# Patient Record
Sex: Male | Born: 1980 | Race: White | Hispanic: No | Marital: Single | Smoking: Never smoker
Health system: Southern US, Community
[De-identification: ages and names within clinical notes are randomized; demographics above are authoritative.]

## PROBLEM LIST (undated history)

## (undated) DIAGNOSIS — D580 Hereditary spherocytosis: Secondary | ICD-10-CM

## (undated) DIAGNOSIS — Z8547 Personal history of malignant neoplasm of testis: Secondary | ICD-10-CM

## (undated) DIAGNOSIS — K121 Other forms of stomatitis: Secondary | ICD-10-CM

## (undated) DIAGNOSIS — S022XXA Fracture of nasal bones, initial encounter for closed fracture: Secondary | ICD-10-CM

## (undated) DIAGNOSIS — J343 Hypertrophy of nasal turbinates: Secondary | ICD-10-CM

## (undated) DIAGNOSIS — K219 Gastro-esophageal reflux disease without esophagitis: Secondary | ICD-10-CM

## (undated) DIAGNOSIS — S0121XA Laceration without foreign body of nose, initial encounter: Secondary | ICD-10-CM

## (undated) DIAGNOSIS — C801 Malignant (primary) neoplasm, unspecified: Secondary | ICD-10-CM

## (undated) HISTORY — DX: Hereditary spherocytosis: D58.0

## (undated) HISTORY — PX: INGUINAL HERNIA REPAIR: SHX194

## (undated) HISTORY — PX: SPLENECTOMY: SUR1306

## (undated) HISTORY — PX: FOOT TENDON SURGERY: SHX958

## (undated) HISTORY — PX: SHOULDER CAPSULORRHAPHY: SUR188

---
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
## 2009-11-06 ENCOUNTER — Encounter: Payer: Self-pay | Admitting: Pulmonary Disease

## 2009-11-19 ENCOUNTER — Emergency Department (HOSPITAL_COMMUNITY): Admission: EM | Admit: 2009-11-19 | Discharge: 2009-11-19 | Payer: Self-pay | Admitting: Emergency Medicine

## 2009-11-28 ENCOUNTER — Ambulatory Visit: Payer: Self-pay | Admitting: Pulmonary Disease

## 2009-11-28 ENCOUNTER — Encounter: Payer: Self-pay | Admitting: Pulmonary Disease

## 2009-11-28 DIAGNOSIS — R0602 Shortness of breath: Secondary | ICD-10-CM | POA: Insufficient documentation

## 2009-11-28 DIAGNOSIS — I10 Essential (primary) hypertension: Secondary | ICD-10-CM | POA: Insufficient documentation

## 2009-12-02 ENCOUNTER — Ambulatory Visit (HOSPITAL_COMMUNITY): Admission: RE | Admit: 2009-12-02 | Discharge: 2009-12-02 | Payer: Self-pay | Admitting: Pulmonary Disease

## 2009-12-02 ENCOUNTER — Encounter: Payer: Self-pay | Admitting: Pulmonary Disease

## 2009-12-09 ENCOUNTER — Telehealth: Payer: Self-pay | Admitting: Pulmonary Disease

## 2009-12-11 ENCOUNTER — Encounter: Payer: Self-pay | Admitting: Pulmonary Disease

## 2010-01-21 ENCOUNTER — Encounter
Admission: RE | Admit: 2010-01-21 | Discharge: 2010-01-21 | Payer: Self-pay | Source: Home / Self Care | Attending: Occupational Medicine | Admitting: Occupational Medicine

## 2010-02-11 NOTE — Assessment & Plan Note (Signed)
Summary: consult for cough and dyspnea   Visit Type:  Initial Consult Copy to:  Lavada Mesi MD Primary Provider/Referring Provider:  Lavada Mesi MD  CC:  Pulmonary consult. pt c/o wheezing when he does activity since being exposed to halotron. Marland Kitchen  History of Present Illness: The pt is a 30y/o male who I have been asked to see for breathing issues.  He was in his usual state of health until June of this year, when he was exposed to the spray of a [employer] forces technical [employer].  He inhaled this into his nose and throat, and subsequently had cough and wheezing thereafter.  He was treated with a course of prednisone with improvement, but he did not return to his usual baseline.  He had a cough that persisted primarily with activity.  He also feels his exertional tolerance is not back to his prior baseline either.  He does well with moderate exertion, but gets sob, cough, and chest tightness with any kind of heavy exertional activity.  Last month he was put on flovent and as needed albuterol, and feels the albuterol does help at times.  He has no prior history of asthma,  and recent cxr shows only prominent BV markings with no acute process.  Current Medications (verified): 1)  Flovent Hfa 44 Mcg/act Aero (Fluticasone Propionate  Hfa) .... 2 Puffs By Mouth Two Times A Day 2)  Ventolin Hfa 108 (90 Base) Mcg/act Aers (Albuterol Sulfate) .... 2 Puffs Every 4 Hrs As Needed  Allergies (verified): 1)  ! Codeine  Past History:  Past Medical History: Hypertension  Past Surgical History: splenectomy right shoulder posterior capsule repair double hernia surgery right toe surgery  Family History: Reviewed history from 11/27/2009 and no changes required. heart disease hypertension  Social History: Reviewed history from 11/27/2009 and no changes required. single Patient never smoked.  occupation--fire fighter  Review of Systems       The patient complains of shortness of breath with  activity, productive cough, weight change, headaches, and nasal congestion/difficulty breathing through nose.  The patient denies shortness of breath at rest, non-productive cough, coughing up blood, chest pain, irregular heartbeats, acid heartburn, indigestion, loss of appetite, abdominal pain, difficulty swallowing, sore throat, tooth/dental problems, sneezing, itching, ear ache, anxiety, depression, hand/feet swelling, joint stiffness or pain, rash, change in color of mucus, and fever.    Vital Signs:  Patient profile:   30 year old male Height:      72 inches Weight:      238.50 pounds BMI:     32.46 O2 Sat:      98 % on Room air Temp:     98 degrees F oral Pulse rate:   66 / minute BP sitting:   126 / 74  (left arm) Cuff size:   large  Vitals Entered By: Carver Fila (November 28, 2009 2:25 PM)  O2 Flow:  Room air CC: Pulmonary consult. pt c/o wheezing when he does activity since being exposed to halotron.  Comments meds and allergies updated Phone number updated Carver Fila  November 28, 2009 2:29 PM    Physical Exam  General:  wd male in nad Eyes:  PERRLA and EOMI.   Nose:  patent without discharge Mouth:  prominent tonsils mild elongation of soft palate and uvula Neck:  no jvd, tmg, LN Lungs:  totally clear to auscultation, no wheezing or rhonchi Heart:  rrr, no mrg Abdomen:  soft and nontender, bs+ Extremities:  no edema or cyanosis, pulses  intact distally  Neurologic:  alert and oriented, moves all 4.   Impression & Recommendations:  Problem # 1:  DYSPNEA (ICD-786.05) the pt has persistent issues with cough and doe after an inhalational exposure to halotron gas.  He has been treated with a course of prednisone and ICS without complete improvement, and his cxr shows no acute process.  It is unclear whether this is simply an upper airway issue from irritation, or whether he may have RAD associated with his exposure.  His spirometry today is totally normal, but he  would need a methacholine challenge test to put the issue to rest.  The pt agrees to proceed.  Medications Added to Medication List This Visit: 1)  Flovent Hfa 44 Mcg/act Aero (Fluticasone propionate  hfa) .... 2 puffs by mouth two times a day 2)  Ventolin Hfa 108 (90 Base) Mcg/act Aers (Albuterol sulfate) .... 2 puffs every 4 hrs as needed  Other Orders: Consultation Level IV (16109) Pulmonary Referral (Pulmonary) Spirometry w/Graph (94010)  Patient Instructions: 1)  stop flovent, but can take albuterol as needed. 2)  will schedule for a methacholine challenge test to look for reactive airways disease. 3)  I will arrange followup once the results are available.   Immunization History:  Influenza Immunization History:    Influenza:  historical (10/12/2009)

## 2010-02-11 NOTE — Miscellaneous (Signed)
  Clinical Lists Changes  meth challenge negative for reactive airways.  I have discussed results with pt.  Very unlikely his symptoms are coming from RAD or asthma.  He may have an upper airway/large airway irritation that is continuing to make him cough with heavy exertional activities.  I have offered to call in tessalon pearls, and also give him one more round of prednisone for upper airway inflammation.  He has prednisone at home, and wil call us with the tablet size.  In the meantime, I have asked him to get back to his usual life including heavy exercise, and if his symptoms return will try to work him into sched within 24 hrs.  he may need upper airway eval with ENT?  Will also consider the role of occult reflux.

## 2010-02-11 NOTE — Progress Notes (Signed)
Summary: results in VIP folder  Phone Note Call from Patient Call back at Home Phone [phone]   Caller: Patient Call For: clance Summary of Call: pt wants results of methacholine challenge test.  Initial call taken by: Tivis Ringer, CNA,  December 09, 2009 11:29 AM  Follow-up for Phone Call        Spoke with patient-aware that results are not in EMR at this time but will send message to [facility] to address. Pt had test on 12-02-09 at [facility]  December 09, 2009 12:31 PM   Additional Follow-up for Phone Call Additional follow up Details #1::        please have the data sent over from [facility] so I can look at. Additional Follow-up by: Barbaraann Share MD,  December 09, 2009 5:55 PM    Additional Follow-up for Phone Call Additional follow up Details #2::    called [facility], spoke with carol in respitory. She will fax data to triage.  Gweneth Dimitri RN  December 10, 2009 8:42 AM  Results received and placed in [facility] very important folder.  Gweneth Dimitri RN  December 10, 2009 8:59 AM    Additional Follow-up for Phone Call Additional follow up Details #3:: Details for Additional Follow-up Action Taken: see clinical list update. Additional Follow-up by: Barbaraann Share MD,  December 11, 2009 5:49 PM

## 2010-02-11 NOTE — Letter (Signed)
Summary: [facility]   Imported By: Sherian Rein 11/18/2009 09:52:47  _____________________________________________________________________  External Attachment:    Type:   Image     Comment:   External Document

## 2010-03-24 ENCOUNTER — Telehealth (INDEPENDENT_AMBULATORY_CARE_PROVIDER_SITE_OTHER): Payer: Self-pay | Admitting: *Deleted

## 2010-04-01 NOTE — Progress Notes (Signed)
Summary: FAX REQUEST  Phone Note From Other Clinic   Caller: SHARON TARP- W/ [employer] Call For: [facility] Summary of Call: WANTS COPY OF LAST OV (IN NOV- 2011). FAX TO: Salome ArntJuel Burrow: [phone]. CONTACT # IS [id number] Initial call taken by: Tivis Ringer, CNA,  March 24, 2010 11:49 AM  Follow-up for Phone Call        Faxed records.//Juanita Follow-up by: Darletta Moll,  March 24, 2010 4:21 PM

## 2010-04-11 ENCOUNTER — Other Ambulatory Visit: Payer: Self-pay | Admitting: Family Medicine

## 2010-04-11 DIAGNOSIS — C6290 Malignant neoplasm of unspecified testis, unspecified whether descended or undescended: Secondary | ICD-10-CM

## 2010-04-15 ENCOUNTER — Ambulatory Visit
Admission: RE | Admit: 2010-04-15 | Discharge: 2010-04-15 | Disposition: A | Discharge status: Home / Self Care | Payer: BC Managed Care – PPO | Source: Ambulatory Visit | Attending: Family Medicine | Admitting: Family Medicine

## 2010-04-15 DIAGNOSIS — C6290 Malignant neoplasm of unspecified testis, unspecified whether descended or undescended: Secondary | ICD-10-CM

## 2010-04-15 MED ORDER — IOHEXOL 300 MG/ML  SOLN
125.0000 mL | Freq: Once | INTRAMUSCULAR | Status: AC | PRN
  Administered 2010-04-15: 125 mL via INTRAVENOUS

## 2010-04-25 ENCOUNTER — Other Ambulatory Visit: Payer: Self-pay | Admitting: Oncology

## 2010-04-25 ENCOUNTER — Encounter (HOSPITAL_BASED_OUTPATIENT_CLINIC_OR_DEPARTMENT_OTHER): Payer: BC Managed Care – PPO | Admitting: Oncology

## 2010-04-25 ENCOUNTER — Ambulatory Visit (HOSPITAL_COMMUNITY)
Admission: RE | Admit: 2010-04-25 | Discharge: 2010-04-25 | Disposition: A | Discharge status: Home / Self Care | Payer: BC Managed Care – PPO | Source: Ambulatory Visit | Attending: Oncology | Admitting: Oncology

## 2010-04-25 DIAGNOSIS — C6290 Malignant neoplasm of unspecified testis, unspecified whether descended or undescended: Secondary | ICD-10-CM | POA: Insufficient documentation

## 2010-04-25 DIAGNOSIS — Z9089 Acquired absence of other organs: Secondary | ICD-10-CM | POA: Insufficient documentation

## 2010-07-24 ENCOUNTER — Encounter (HOSPITAL_BASED_OUTPATIENT_CLINIC_OR_DEPARTMENT_OTHER): Payer: BC Managed Care – PPO | Admitting: Oncology

## 2010-07-24 ENCOUNTER — Other Ambulatory Visit: Payer: Self-pay | Admitting: Oncology

## 2010-07-24 DIAGNOSIS — C6290 Malignant neoplasm of unspecified testis, unspecified whether descended or undescended: Secondary | ICD-10-CM

## 2010-07-24 LAB — CBC WITH DIFFERENTIAL/PLATELET
BASO%: 0.6 % (ref 0.0–2.0)
Basophils Absolute: 0.1 10*3/uL (ref 0.0–0.1)
EOS%: 1.1 % (ref 0.0–7.0)
HCT: 46.1 % (ref 38.4–49.9)
LYMPH%: 20.1 % (ref 14.0–49.0)
MCH: 35.3 pg — ABNORMAL HIGH (ref 27.2–33.4)
MCHC: 36.7 g/dL — ABNORMAL HIGH (ref 32.0–36.0)
MCV: 96.1 fL (ref 79.3–98.0)
MONO%: 6.2 % (ref 0.0–14.0)
NEUT%: 72 % (ref 39.0–75.0)
lymph#: 1.9 10*3/uL (ref 0.9–3.3)

## 2010-07-27 LAB — COMPREHENSIVE METABOLIC PANEL
ALT: 33 U/L (ref 0–53)
AST: 27 U/L (ref 0–37)
Alkaline Phosphatase: 61 U/L (ref 39–117)
BUN: 17 mg/dL (ref 6–23)
Chloride: 103 mEq/L (ref 96–112)
Creatinine, Ser: 0.92 mg/dL (ref 0.50–1.35)
Total Bilirubin: 2.5 mg/dL — ABNORMAL HIGH (ref 0.3–1.2)

## 2010-07-27 LAB — BETA HCG QUANT (REF LAB): Beta hCG, Tumor Marker: <0.5 m[IU]/mL (ref <5.0)

## 2010-10-21 ENCOUNTER — Other Ambulatory Visit: Payer: Self-pay | Admitting: Oncology

## 2010-10-21 ENCOUNTER — Ambulatory Visit (HOSPITAL_COMMUNITY)
Admission: RE | Admit: 2010-10-21 | Discharge: 2010-10-21 | Disposition: A | Discharge status: Home / Self Care | Payer: BC Managed Care – PPO | Source: Ambulatory Visit | Attending: Oncology | Admitting: Oncology

## 2010-10-21 ENCOUNTER — Encounter (HOSPITAL_BASED_OUTPATIENT_CLINIC_OR_DEPARTMENT_OTHER): Payer: BC Managed Care – PPO | Admitting: Oncology

## 2010-10-21 DIAGNOSIS — C6290 Malignant neoplasm of unspecified testis, unspecified whether descended or undescended: Secondary | ICD-10-CM

## 2010-10-21 DIAGNOSIS — Z9089 Acquired absence of other organs: Secondary | ICD-10-CM | POA: Insufficient documentation

## 2010-10-21 LAB — CMP (CANCER CENTER ONLY)
ALT(SGPT): 32 U/L (ref 10–47)
CO2: 27 mEq/L (ref 18–33)
Calcium: 9.4 mg/dL (ref 8.0–10.3)
Chloride: 103 mEq/L (ref 98–108)
Creat: 0.8 mg/dl (ref 0.6–1.2)
Glucose, Bld: 101 mg/dL (ref 73–118)
Sodium: 143 mEq/L (ref 128–145)
Total Bilirubin: 2.3 mg/dl — ABNORMAL HIGH (ref 0.20–1.60)
Total Protein: 7.6 g/dL (ref 6.4–8.1)

## 2010-10-21 LAB — CBC WITH DIFFERENTIAL/PLATELET
BASO%: 1 % (ref 0.0–2.0)
Eosinophils Absolute: 0.1 10*3/uL (ref 0.0–0.5)
HCT: 45.9 % (ref 38.4–49.9)
HGB: 16.9 g/dL (ref 13.0–17.1)
LYMPH%: 30.1 % (ref 14.0–49.0)
MCHC: 36.8 g/dL — ABNORMAL HIGH (ref 32.0–36.0)
MONO#: 0.9 10*3/uL (ref 0.1–0.9)
NEUT#: 4.9 10*3/uL (ref 1.5–6.5)
NEUT%: 57.1 % (ref 39.0–75.0)
Platelets: 471 10*3/uL — ABNORMAL HIGH (ref 140–400)
WBC: 8.6 10*3/uL (ref 4.0–10.3)
lymph#: 2.6 10*3/uL (ref 0.9–3.3)

## 2010-10-21 MED ORDER — IOHEXOL 300 MG/ML  SOLN
100.0000 mL | Freq: Once | INTRAMUSCULAR | Status: AC | PRN
  Administered 2010-10-21: 100 mL via INTRAVENOUS

## 2010-10-24 LAB — LACTATE DEHYDROGENASE: LDH: 137 U/L (ref 94–250)

## 2010-10-24 LAB — AFP TUMOR MARKER: AFP-Tumor Marker: 4.4 ng/mL (ref 0.0–8.0)

## 2011-01-10 ENCOUNTER — Telehealth: Payer: Self-pay | Admitting: Oncology

## 2011-01-10 NOTE — Telephone Encounter (Signed)
pt was due for 10/2010 appt but was moved due to epic,pt r/s to 03/05/11,pt aware   aom

## 2011-01-30 ENCOUNTER — Other Ambulatory Visit: Payer: BC Managed Care – PPO | Admitting: Lab

## 2011-01-30 ENCOUNTER — Ambulatory Visit: Payer: BC Managed Care – PPO | Admitting: Oncology

## 2011-02-09 ENCOUNTER — Encounter: Payer: Self-pay | Admitting: *Deleted

## 2011-02-27 ENCOUNTER — Encounter: Payer: Self-pay | Admitting: *Deleted

## 2011-03-05 ENCOUNTER — Ambulatory Visit (HOSPITAL_BASED_OUTPATIENT_CLINIC_OR_DEPARTMENT_OTHER): Payer: BC Managed Care – PPO | Admitting: Oncology

## 2011-03-05 ENCOUNTER — Telehealth: Payer: Self-pay | Admitting: Oncology

## 2011-03-05 ENCOUNTER — Ambulatory Visit: Payer: BC Managed Care – PPO | Admitting: Lab

## 2011-03-05 VITALS — BP 125/69 | HR 65 | Temp 97.0°F | Ht 73.2 in | Wt 239.6 lb

## 2011-03-05 DIAGNOSIS — C6290 Malignant neoplasm of unspecified testis, unspecified whether descended or undescended: Secondary | ICD-10-CM

## 2011-03-05 LAB — CBC WITH DIFFERENTIAL/PLATELET
Eosinophils Absolute: 0.1 10*3/uL (ref 0.0–0.5)
MONO#: 0.9 10*3/uL (ref 0.1–0.9)
NEUT#: 4.9 10*3/uL (ref 1.5–6.5)
RBC: 4.82 10*6/uL (ref 4.20–5.82)
RDW: 12.8 % (ref 11.0–14.6)
WBC: 8.3 10*3/uL (ref 4.0–10.3)
lymph#: 2.4 10*3/uL (ref 0.9–3.3)

## 2011-03-05 NOTE — Telephone Encounter (Signed)
8/20 appt made and printed for pt  aom

## 2011-03-05 NOTE — Progress Notes (Signed)
Hematology and Oncology Follow Up Visit  [name] [id number] 1980-08-09 31 y.o. 03/05/2011 9:16 AM CC: [name], M.D.    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  A lot of the details remained elusive.  At this point current therapy is an active surveillance.  Interim History:  [name] is a pleasant 31 year old gentleman, whom I saw for the first time back in April 2011, was referred here by [name] after he gave a personal history of testicular cancer that was treated out of town.  He has had imaging studies done by [name] as well as tumor markers and all have been an unremarkable.  He expressed interest in establishing care here at the cancer center for active surveillance.  Since the last time I saw him really he did not report any problems or any complications.  Had not had any health issues.  Did not report any abdominal pain, had not reported any pelvic pain.  Had not reported any masses or lesions.  Overall performance status activity level remains excellent. He is reporting shoulder pain and being evaluated by [name].  Medications: I have reviewed the patient's current medications. Current outpatient prescriptions:cetirizine (ZYRTEC) 10 MG tablet, Take 10 mg by mouth as needed., Disp: , Rfl:   Allergies:  Allergies  Allergen Reactions  . Codeine     REACTION: hyper    Past Medical History, Surgical history, Social history, and Family History were reviewed and updated.  Review of Systems: Constitutional:  Negative for fever, chills, night sweats, anorexia, weight loss, pain. Cardiovascular: no chest pain or dyspnea on exertion Respiratory: no cough, shortness of breath, or wheezing Neurological: no TIA or stroke symptoms Dermatological: negative ENT: negative Skin: Negative. Gastrointestinal: no abdominal pain, change in  bowel habits, or black or bloody stools Genito-Urinary: no dysuria, trouble voiding, or hematuria Hematological and Lymphatic: negative Breast: negative Musculoskeletal: negative Remaining ROS negative. Physical Exam: Blood pressure 125/69, pulse 65, temperature 97 F (36.1 C), temperature source Oral, height 6' 1.2" (1.859 m), weight 239 lb 9.6 oz (108.682 kg). ECOG: 0 General appearance: alert Head: Normocephalic, without obvious abnormality, atraumatic Neck: no adenopathy, no carotid bruit, no JVD, supple, symmetrical, trachea midline and thyroid not enlarged, symmetric, no tenderness/mass/nodules Lymph nodes: Cervical, supraclavicular, and axillary nodes normal. Heart:regular rate and rhythm, S1, S2 normal, no murmur, click, rub or gallop Lung:chest clear, no wheezing, rales, normal symmetric air entry Abdomin: soft, non-tender, without masses or organomegaly EXT:no erythema, induration, or nodules   Lab Results: Lab Results  Component Value Date   WBC 8.6 10/21/2010   HGB 16.9 10/21/2010   HCT 45.9 10/21/2010   MCV 95.9 10/21/2010   PLT 471* 10/21/2010     Chemistry      Component Value Date/Time   NA 143 10/21/2010 0804   NA 139 07/24/2010 0842   NA 139 07/24/2010 0842   NA 139 07/24/2010 0842   K 4.6 10/21/2010 0804   K 4.5 07/24/2010 0842   K 4.5 07/24/2010 0842   K 4.5 07/24/2010 0842   CL 103 10/21/2010 0804   CL 103 07/24/2010 0842   CL 103 07/24/2010 0842   CL 103 07/24/2010 0842   CO2 27 10/21/2010 0804   CO2 28 07/24/2010 0842   CO2 28 07/24/2010 0842   CO2 28 07/24/2010 0842   BUN 11 10/21/2010 0804   BUN 17  07/24/2010 0842   BUN 17 07/24/2010 0842   BUN 17 07/24/2010 0842   CREATININE 0.8 10/21/2010 0804   CREATININE 0.92 07/24/2010 0842   CREATININE 0.92 07/24/2010 0842   CREATININE 0.92 07/24/2010 0842      Component Value Date/Time   CALCIUM 9.4 10/21/2010 0804   CALCIUM 10.6* 07/24/2010 0842   CALCIUM 10.6* 07/24/2010 0842   CALCIUM 10.6* 07/24/2010 0842   ALKPHOS 57  10/21/2010 0804   ALKPHOS 61 07/24/2010 0842   ALKPHOS 61 07/24/2010 0842   ALKPHOS 61 07/24/2010 0842   AST 23 10/21/2010 0804   AST 27 07/24/2010 0842   AST 27 07/24/2010 0842   AST 27 07/24/2010 0842   ALT 33 07/24/2010 0842   ALT 33 07/24/2010 0842   ALT 33 07/24/2010 0842   BILITOT 2.30* 10/21/2010 0804   BILITOT 2.5* 07/24/2010 0842   BILITOT 2.5* 07/24/2010 0842   BILITOT 2.5* 07/24/2010 0842        Impression and Plan:  This is a pleasant 31 year old with the following issues.   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  At this point I really see no evidence of any recurrent disease.  At this point there is really no evidence of any malignancy on CT scan, chest x-ray or tumor marker.  For the time being I will continue to perform active surveillance of [name].I will be hesitant to do any more screening studies on him without any concrete documentation of the exact pathology at this point.  [name] continues to be vague in terms of his history as given me from his testicular cancer standpoint and I was not able to get the exact pathology and the exact treatment that he has received; however, if we are able to establish that between now and then, then we will resume an active surveillance as scheduled.  [facility], MD 2/21/20139:16 AM

## 2011-03-08 LAB — COMPREHENSIVE METABOLIC PANEL
Albumin: 4.4 g/dL (ref 3.5–5.2)
Alkaline Phosphatase: 56 U/L (ref 39–117)
CO2: 26 mEq/L (ref 19–32)
Calcium: 10.2 mg/dL (ref 8.4–10.5)
Chloride: 106 mEq/L (ref 96–112)
Glucose, Bld: 97 mg/dL (ref 70–99)
Potassium: 4.6 mEq/L (ref 3.5–5.3)
Sodium: 141 mEq/L (ref 135–145)
Total Protein: 7.3 g/dL (ref 6.0–8.3)

## 2011-03-08 LAB — AFP TUMOR MARKER: AFP-Tumor Marker: 3.1 ng/mL (ref 0.0–8.0)

## 2011-09-01 ENCOUNTER — Ambulatory Visit: Payer: BC Managed Care – PPO | Admitting: Oncology

## 2011-09-01 ENCOUNTER — Other Ambulatory Visit: Payer: BC Managed Care – PPO

## 2011-09-16 ENCOUNTER — Ambulatory Visit (HOSPITAL_BASED_OUTPATIENT_CLINIC_OR_DEPARTMENT_OTHER): Payer: BC Managed Care – PPO | Admitting: Oncology

## 2011-09-16 ENCOUNTER — Telehealth: Payer: Self-pay | Admitting: Oncology

## 2011-09-16 ENCOUNTER — Other Ambulatory Visit (HOSPITAL_BASED_OUTPATIENT_CLINIC_OR_DEPARTMENT_OTHER): Payer: BC Managed Care – PPO

## 2011-09-16 VITALS — BP 137/76 | HR 67 | Temp 97.2°F | Resp 20 | Ht 73.0 in | Wt 239.2 lb

## 2011-09-16 DIAGNOSIS — C6290 Malignant neoplasm of unspecified testis, unspecified whether descended or undescended: Secondary | ICD-10-CM

## 2011-09-16 LAB — COMPREHENSIVE METABOLIC PANEL (CC13)
CO2: 25 mEq/L (ref 22–29)
Creatinine: 1 mg/dL (ref 0.7–1.3)
Glucose: 100 mg/dl — ABNORMAL HIGH (ref 70–99)
Total Bilirubin: 2.8 mg/dL — ABNORMAL HIGH (ref 0.20–1.20)

## 2011-09-16 LAB — CBC WITH DIFFERENTIAL/PLATELET
Basophils Absolute: 0 10*3/uL (ref 0.0–0.1)
EOS%: 1.1 % (ref 0.0–7.0)
HGB: 16.6 g/dL (ref 13.0–17.1)
MCH: 33.7 pg — ABNORMAL HIGH (ref 27.2–33.4)
MCV: 91.9 fL (ref 79.3–98.0)
MONO%: 9.5 % (ref 0.0–14.0)
NEUT%: 58.1 % (ref 39.0–75.0)
RDW: 12.4 % (ref 11.0–14.6)

## 2011-09-16 NOTE — Telephone Encounter (Signed)
appts made and printed for  Pt aom °

## 2011-09-16 NOTE — Progress Notes (Signed)
Hematology and Oncology Follow Up Visit  [name] [id number] 02-05-1980 30 y.o. 09/16/2011 9:42 AM CC: [name], M.D.    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  A lot of the details remained elusive.  At this point current therapy is an active surveillance.  Interim History:  [name] is a pleasant 31 year old gentleman, whom I saw for the first time back in April 2011, was referred here by Dr. Prince Rome after he gave a personal history of testicular cancer that was treated out of town.  He has had imaging studies done by Dr. Prince Rome as well as tumor markers and all have been an unremarkable.  He expressed interest in establishing care here at the cancer center for active surveillance.  Since the last time I saw him really he did not report any problems or any complications.  Had not had any health issues.  Did not report any abdominal pain, had not reported any pelvic pain.  Had not reported any masses or lesions.  Overall performance status activity level remains excellent.   Medications: I have reviewed the patient's current medications. Current outpatient prescriptions:cetirizine (ZYRTEC) 10 MG tablet, Take 10 mg by mouth as needed., Disp: , Rfl:   Allergies:  Allergies  Allergen Reactions  . Codeine     REACTION: hyper    Past Medical History, Surgical history, Social history, and Family History were reviewed and updated.  Review of Systems: Constitutional:  Negative for fever, chills, night sweats, anorexia, weight loss, pain. Cardiovascular: no chest pain or dyspnea on exertion Respiratory: no cough, shortness of breath, or wheezing Neurological: no TIA or stroke symptoms Dermatological: negative ENT: negative Skin: Negative. Gastrointestinal: no abdominal pain, change in bowel habits, or black or bloody stools Genito-Urinary: no  dysuria, trouble voiding, or hematuria Hematological and Lymphatic: negative Breast: negative Musculoskeletal: negative Remaining ROS negative. Physical Exam: Blood pressure 137/76, pulse 67, temperature 97.2 F (36.2 C), temperature source Oral, resp. rate 20, height 6\' 1"  (1.854 m), weight 239 lb 3.2 oz (108.5 kg). ECOG: 0 General appearance: alert Head: Normocephalic, without obvious abnormality, atraumatic Neck: no adenopathy, no carotid bruit, no JVD, supple, symmetrical, trachea midline and thyroid not enlarged, symmetric, no tenderness/mass/nodules Lymph nodes: Cervical, supraclavicular, and axillary nodes normal. Heart:regular rate and rhythm, S1, S2 normal, no murmur, click, rub or gallop Lung:chest clear, no wheezing, rales, normal symmetric air entry Abdomin: soft, non-tender, without masses or organomegaly EXT:no erythema, induration, or nodules   Lab Results: Lab Results  Component Value Date   WBC 8.1 09/16/2011   HGB 16.6 09/16/2011   HCT 45.3 09/16/2011   MCV 91.9 09/16/2011   PLT 521* 09/16/2011     Chemistry      Component Value Date/Time   NA 141 03/05/2011 0939   NA 143 10/21/2010 0804   K 4.6 03/05/2011 0939   K 4.6 10/21/2010 0804   CL 106 03/05/2011 0939   CL 103 10/21/2010 0804   CO2 26 03/05/2011 0939   CO2 27 10/21/2010 0804   BUN 15 03/05/2011 0939   BUN 11 10/21/2010 0804   CREATININE 0.86 03/05/2011 0939   CREATININE 0.8 10/21/2010 0804      Component Value Date/Time   CALCIUM 10.2 03/05/2011 0939   CALCIUM 9.4 10/21/2010 0804   ALKPHOS 56 03/05/2011 0939   ALKPHOS 57 10/21/2010 0804   AST 22 03/05/2011 0939  AST 23 10/21/2010 0804   ALT 20 03/05/2011 0939   BILITOT 2.2* 03/05/2011 0939   BILITOT 2.30* 10/21/2010 0804        Impression and Plan:  This is a pleasant 31 year old with the following issues.   A presumed  testes cancer diagnosed in 2011.  At this point I really see no evidence of any recurrent disease.  At this point there was really no  evidence of any malignancy on CT scan, chest x-ray .  He did have an elevation in his Beta hCG and I will repeat CT scans ASAP and as well as repeat tumor markers today. I will continue to perform active surveillance of [name] if his scans remain negative.   [name] continues to be vague in terms of his history as given me from his testicular cancer standpoint and I was not able to get the exact pathology and the exact treatment that he has received. If his Ct scan show relapse disease, he will need a biopsy at this time.   [facility], MD 9/4/20139:42 AM

## 2011-09-19 LAB — AFP TUMOR MARKER: AFP-Tumor Marker: 4.6 ng/mL (ref 0.0–8.0)

## 2011-09-22 ENCOUNTER — Ambulatory Visit (HOSPITAL_COMMUNITY)
Admission: RE | Admit: 2011-09-22 | Discharge: 2011-09-22 | Disposition: A | Discharge status: Home / Self Care | Payer: BC Managed Care – PPO | Source: Ambulatory Visit | Attending: Oncology | Admitting: Oncology

## 2011-09-22 DIAGNOSIS — C6290 Malignant neoplasm of unspecified testis, unspecified whether descended or undescended: Secondary | ICD-10-CM | POA: Insufficient documentation

## 2011-09-22 DIAGNOSIS — Z9089 Acquired absence of other organs: Secondary | ICD-10-CM | POA: Insufficient documentation

## 2011-09-22 DIAGNOSIS — R911 Solitary pulmonary nodule: Secondary | ICD-10-CM | POA: Insufficient documentation

## 2011-09-22 MED ORDER — IOHEXOL 300 MG/ML  SOLN
100.0000 mL | Freq: Once | INTRAMUSCULAR | Status: AC | PRN
  Administered 2011-09-22: 100 mL via INTRAVENOUS

## 2012-03-10 ENCOUNTER — Telehealth: Payer: Self-pay | Admitting: Oncology

## 2012-03-11 ENCOUNTER — Ambulatory Visit: Payer: BC Managed Care – PPO | Admitting: Oncology

## 2012-03-11 ENCOUNTER — Other Ambulatory Visit: Payer: BC Managed Care – PPO

## 2012-03-21 ENCOUNTER — Emergency Department (HOSPITAL_COMMUNITY)
Admission: EM | Admit: 2012-03-21 | Discharge: 2012-03-21 | Disposition: A | Discharge status: Home / Self Care | Payer: BC Managed Care – PPO | Attending: Emergency Medicine | Admitting: Emergency Medicine

## 2012-03-21 ENCOUNTER — Encounter (HOSPITAL_COMMUNITY): Payer: Self-pay | Admitting: Emergency Medicine

## 2012-03-21 ENCOUNTER — Emergency Department (HOSPITAL_COMMUNITY): Payer: BC Managed Care – PPO

## 2012-03-21 DIAGNOSIS — S61209A Unspecified open wound of unspecified finger without damage to nail, initial encounter: Secondary | ICD-10-CM | POA: Insufficient documentation

## 2012-03-21 DIAGNOSIS — Y929 Unspecified place or not applicable: Secondary | ICD-10-CM | POA: Insufficient documentation

## 2012-03-21 DIAGNOSIS — J45909 Unspecified asthma, uncomplicated: Secondary | ICD-10-CM | POA: Insufficient documentation

## 2012-03-21 DIAGNOSIS — Y9389 Activity, other specified: Secondary | ICD-10-CM | POA: Insufficient documentation

## 2012-03-21 DIAGNOSIS — W298XXA Contact with other powered powered hand tools and household machinery, initial encounter: Secondary | ICD-10-CM | POA: Insufficient documentation

## 2012-03-21 DIAGNOSIS — Z8547 Personal history of malignant neoplasm of testis: Secondary | ICD-10-CM | POA: Insufficient documentation

## 2012-03-21 DIAGNOSIS — S61409A Unspecified open wound of unspecified hand, initial encounter: Secondary | ICD-10-CM | POA: Insufficient documentation

## 2012-03-21 DIAGNOSIS — D580 Hereditary spherocytosis: Secondary | ICD-10-CM | POA: Insufficient documentation

## 2012-03-21 MED ORDER — IBUPROFEN 800 MG PO TABS
800.0000 mg | ORAL_TABLET | Freq: Once | ORAL | Status: AC
  Administered 2012-03-21: 800 mg via ORAL
  Filled 2012-03-21: qty 1

## 2012-03-21 MED ORDER — TETANUS-DIPHTH-ACELL PERTUSSIS 5-2.5-18.5 LF-MCG/0.5 IM SUSP
0.5000 mL | Freq: Once | INTRAMUSCULAR | Status: AC
  Administered 2012-03-21: 0.5 mL via INTRAMUSCULAR
  Filled 2012-03-21: qty 0.5

## 2012-03-21 NOTE — ED Notes (Signed)
Pt complains of "I cut my finger with a chainsaw" bleeding controlled at this time.

## 2012-03-21 NOTE — ED Notes (Signed)
Patient transported to X-ray 

## 2012-03-21 NOTE — ED Provider Notes (Signed)
History     CSN: [id number]  Arrival date & time 03/21/12  1108   First MD Initiated Contact with Patient 03/21/12 1115      No chief complaint on file.   (Consider location/radiation/quality/duration/timing/severity/associated sxs/prior treatment) HPI Comments: 33 y.o. Male present complaining of laceration to left pinky and left palm after using chain saw earlier today. Bleeding is well-controlled. Unknown tetanus. Pain is 3/10. Pt took no interventions. Worse with movement. Each lac is straight edged, approx 1.5 cm in length.  Denies fever, dizziness, numbness, tingling. Able to move all digits.    Patient is a 32 y.o. male presenting with skin laceration.  Laceration   Past Medical History  Diagnosis Date  . Malignant neoplasm of other and unspecified testis   . Asthma   . Spherocytosis     Past Surgical History  Procedure Laterality Date  . Splenectomy    . Hernia repair    . Testicular ca      No family history on file.  History  Substance Use Topics  . Smoking status: Not on file  . Smokeless tobacco: Not on file  . Alcohol Use:       Review of Systems  Constitutional: Negative for fever and diaphoresis.  HENT: Negative for neck pain and neck stiffness.   Eyes: Negative for visual disturbance.  Respiratory: Negative for apnea, chest tightness and shortness of breath.   Cardiovascular: Negative for chest pain and palpitations.  Gastrointestinal: Negative for nausea, vomiting, diarrhea and constipation.  Genitourinary: Negative for dysuria.  Musculoskeletal: Negative for gait problem.  Skin: Positive for wound.       Laceration to left hand and left pinky.  Neurological: Negative for dizziness, weakness, light-headedness, numbness and headaches.    Allergies  Codeine  Home Medications   Current Outpatient Rx  Name  Route  Sig  Dispense  Refill  . cetirizine (ZYRTEC) 10 MG tablet   Oral   Take 10 mg by mouth as needed.         Marland Kitchen  PRESCRIPTION MEDICATION   Nasal   Place 1 spray into the nose daily. Q nasl spray . Information given by patient, unable to find in formulary           There were no vitals taken for this visit.  Physical Exam  Nursing note and vitals reviewed. Constitutional: He is oriented to person, place, and time. He appears well-developed and well-nourished. No distress.  HENT:  Head: Normocephalic and atraumatic.  Eyes: EOM are normal. Pupils are equal, round, and reactive to light.  Neck: Normal range of motion. Neck supple.  No meningeal signs  Cardiovascular: Normal rate, regular rhythm and normal heart sounds.  Exam reveals no gallop and no friction rub.   No murmur heard. Pulmonary/Chest: Effort normal and breath sounds normal. No respiratory distress. He has no wheezes. He has no rales. He exhibits no tenderness.  Abdominal: Soft. Bowel sounds are normal. He exhibits no distension. There is no tenderness. There is no rebound and no guarding.  Musculoskeletal: Normal range of motion. He exhibits tenderness. He exhibits no edema.  FROM good strength at injured site. No joint or ligament involvement.  Neurological: He is alert and oriented to person, place, and time. No cranial nerve deficit.  No focal deficits. Sensation to light touch intact. Two point discrimination intact.   Skin: Skin is warm and dry. He is not diaphoretic. No erythema.  1.5 cm laceration to distal palmar aspect of left hand  proximal to base of the 5th digit. 1.5 cm laceration to palmar aspect of proximal phalynx of 5th digit.     ED Course  Procedures (including critical care time)   LACERATION REPAIR Performed by: Glade Nurse Authorized by: Glade Nurse Consent: Verbal consent obtained. Risks and benefits: risks, benefits and alternatives were discussed Consent given by: patient Patient identity confirmed: provided demographic data Prepped and Draped in normal sterile fashion Wound explored  Laceration  Location: palmar aspect of proximal phalynx of 5th digit  Laceration Length: 1.5 cm  No Foreign Bodies seen or palpated  Anesthesia: local infiltration  Local anesthetic: lidocaine 2 % without epinephrine  Anesthetic total: 2 ml  Irrigation method: syringe Amount of cleaning: standard  Skin closure: 4-0 prolene  Number of sutures: 2  Technique: simple interrupted  Patient tolerance: Patient tolerated the procedure well with no immediate complications. LACERATION REPAIR Performed by: Glade Nurse Authorized by: Glade Nurse Consent: Verbal consent obtained. Risks and benefits: risks, benefits and alternatives were discussed Consent given by: patient Patient identity confirmed: provided demographic data Prepped and Draped in normal sterile fashion Wound explored  Laceration Location: distal palmar aspect of left hand proximal to base of the 5th digi  Laceration Length: 1.5 cm  No Foreign Bodies seen or palpated  Anesthesia: local infiltration  Local anesthetic: lidocaine 2 % without epinephrine  Anesthetic total: 2 ml  Irrigation method: syringe Amount of cleaning: standard  Skin closure: 4-0 prolene  Number of sutures: 2  Technique: simple interrupted  Patient tolerance: Patient tolerated the procedure well with no immediate complications.   Labs Reviewed - No data to display No results found.  Dg Hand Complete Left  03/21/2012  *RADIOLOGY REPORT*  Clinical Data: Laceration by chain saw  LEFT HAND - COMPLETE 3+ VIEW  Comparison: None.  Findings: No acute fracture is seen.  Alignment is normal.  Joint spaces appear normal.  No opaque foreign body is noted.  IMPRESSION: Negative.   Original Report Authenticated By: Dwyane Dee, M.D.    Diagnosis: laceration to 5th digit, palmar aspect of left hand.     MDM  1.5 cm laceration to distal palmar aspect of left hand proximal to base of the 5th digit. 1.5 cm laceration to palmar aspect of proximal phalynx  of 5th digit. No ligament or tendon involvement. Good strength. Tdap booster given. Wound cleaning complete with pressure irrigation, bottom of wound visualized, no foreign bodies appreciated visually or via xray. Laceration occurred < 8 hours prior to repair which was well tolerated. Pt has no co morbidities to effect normal wound healing. Discussed suture home care w pt and answered questions. Pt to f-u for wound check and suture removal in 7 days. Pt is hemodynamically stable w no complaints prior to dc.      Glade Nurse, PA-C 03/21/12 1623

## 2012-03-21 NOTE — ED Notes (Signed)
MD at bedside. 

## 2012-03-22 NOTE — ED Provider Notes (Signed)
Small laceration to the R palmar distal hand and proximal small finger caused by chain saw accident.  No dec in ROM, normal tendon function flexor and extensor - wound explored, no FB seen, no FB on xray, wound repaired by [facility].  Pt otherwise without significant injury or c/o.  Medical screening examination/treatment/procedure(s) were conducted as a shared visit with non-physician practitioner(s) and myself.  I personally evaluated the patient during the encounter     Vida Roller, MD 03/22/12 [id number]

## 2012-08-08 ENCOUNTER — Other Ambulatory Visit (HOSPITAL_BASED_OUTPATIENT_CLINIC_OR_DEPARTMENT_OTHER): Payer: BC Managed Care – PPO | Admitting: Lab

## 2012-08-08 DIAGNOSIS — C6290 Malignant neoplasm of unspecified testis, unspecified whether descended or undescended: Secondary | ICD-10-CM

## 2012-08-08 LAB — COMPREHENSIVE METABOLIC PANEL (CC13)
Albumin: 4 g/dL (ref 3.5–5.0)
Alkaline Phosphatase: 63 U/L (ref 40–150)
CO2: 25 mEq/L (ref 22–29)
Calcium: 9.6 mg/dL (ref 8.4–10.4)
Chloride: 107 mEq/L (ref 98–109)
Glucose: 126 mg/dl (ref 70–140)
Potassium: 4.4 mEq/L (ref 3.5–5.1)
Sodium: 140 mEq/L (ref 136–145)
Total Protein: 7.7 g/dL (ref 6.4–8.3)

## 2012-08-08 LAB — CBC WITH DIFFERENTIAL/PLATELET
Basophils Absolute: 0 10*3/uL (ref 0.0–0.1)
Eosinophils Absolute: 0.1 10*3/uL (ref 0.0–0.5)
HCT: 45.9 % (ref 38.4–49.9)
HGB: 16.9 g/dL (ref 13.0–17.1)
MONO#: 0.7 10*3/uL (ref 0.1–0.9)
NEUT#: 5 10*3/uL (ref 1.5–6.5)
RDW: 12.2 % (ref 11.0–14.6)
lymph#: 2.9 10*3/uL (ref 0.9–3.3)

## 2012-08-11 ENCOUNTER — Telehealth: Payer: Self-pay | Admitting: Oncology

## 2012-08-11 ENCOUNTER — Ambulatory Visit (HOSPITAL_BASED_OUTPATIENT_CLINIC_OR_DEPARTMENT_OTHER): Payer: BC Managed Care – PPO | Admitting: Oncology

## 2012-08-11 VITALS — BP 132/71 | HR 68 | Temp 97.5°F | Resp 20 | Ht 73.0 in | Wt 243.6 lb

## 2012-08-11 DIAGNOSIS — C6290 Malignant neoplasm of unspecified testis, unspecified whether descended or undescended: Secondary | ICD-10-CM

## 2012-08-11 LAB — AFP TUMOR MARKER: AFP-Tumor Marker: 4.2 ng/mL (ref 0.0–8.0)

## 2012-08-11 LAB — BETA HCG QUANT (REF LAB): Beta hCG, Tumor Marker: <0.5 m[IU]/mL (ref <5.0)

## 2012-08-11 NOTE — Progress Notes (Signed)
Hematology and Oncology Follow Up Visit  [name] [id number] 1980-07-09 32 y.o. 08/11/2012 8:55 AM CC: [name], M.D.    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  A lot of the details remained elusive.  At this point current therapy is an active surveillance.  Interim History:  [name] is a pleasant 32 year old gentleman, whom I saw for the first time back in April 2011, was referred here by [name] after he gave a personal history of testicular cancer that was treated out of town.  He has had imaging studies as well as tumor markers and all have been an unremarkable since then.  Since the last time I saw him really he did not report any problems or any complications.  Had not had any health issues.  Did not report any abdominal pain, had not reported any pelvic pain.  Had not reported any masses or lesions.  Overall performance status activity level remains excellent. He is still working full time.    Medications: I have reviewed the patient's current medications.  Current Outpatient Prescriptions  Medication Sig Dispense Refill  . oxymetazoline (AFRIN) 0.05 % nasal spray Place 1 spray into the nose 2 (two) times daily.       No current facility-administered medications for this visit.    Allergies:  Allergies  Allergen Reactions  . Codeine     REACTION: hyper    Past Medical History, Surgical history, Social history, and Family History were reviewed and updated.  Review of Systems: Constitutional:  Negative for fever, chills, night sweats, anorexia, weight loss, pain. Cardiovascular: no chest pain or dyspnea on exertion Respiratory: no cough, shortness of breath, or wheezing Neurological: no TIA or stroke symptoms Dermatological: negative ENT: negative Skin: Negative. Gastrointestinal: no abdominal pain, change in bowel  habits, or black or bloody stools Genito-Urinary: no dysuria, trouble voiding, or hematuria Hematological and Lymphatic: negative Breast: negative Musculoskeletal: negative Remaining ROS negative. Physical Exam: Blood pressure 132/71, pulse 68, temperature 97.5 F (36.4 C), temperature source Oral, resp. rate 20, height 6\' 1"  (1.854 m), weight 243 lb 9.6 oz (110.496 kg). ECOG: 0 General appearance: alert Head: Normocephalic, without obvious abnormality, atraumatic Neck: no adenopathy, no carotid bruit, no JVD, supple, symmetrical, trachea midline and thyroid not enlarged, symmetric, no tenderness/mass/nodules Lymph nodes: Cervical, supraclavicular, and axillary nodes normal. Heart:regular rate and rhythm, S1, S2 normal, no murmur, click, rub or gallop Lung:chest clear, no wheezing, rales, normal symmetric air entry Abdomin: soft, non-tender, without masses or organomegaly EXT:no erythema, induration, or nodules   Lab Results: Lab Results  Component Value Date   WBC 8.6 08/08/2012   HGB 16.9 08/08/2012   HCT 45.9 08/08/2012   MCV 92.4 08/08/2012   PLT 519 few clumps not affecting count* 08/08/2012     Chemistry      Component Value Date/Time   NA 140 08/08/2012 0820   NA 141 03/05/2011 0939   NA 143 10/21/2010 0804   K 4.4 08/08/2012 0820   K 4.6 03/05/2011 0939   K 4.6 10/21/2010 0804   CL 105 09/16/2011 0902   CL 106 03/05/2011 0939   CL 103 10/21/2010 0804   CO2 25 08/08/2012 0820   CO2 26 03/05/2011 0939   CO2 27 10/21/2010 0804   BUN 13.2 08/08/2012 0820   BUN 15 03/05/2011 0939   BUN 11 10/21/2010 0804   CREATININE  0.8 08/08/2012 0820   CREATININE 0.86 03/05/2011 0939   CREATININE 0.8 10/21/2010 0804      Component Value Date/Time   CALCIUM 9.6 08/08/2012 0820   CALCIUM 10.2 03/05/2011 0939   CALCIUM 9.4 10/21/2010 0804   ALKPHOS 63 08/08/2012 0820   ALKPHOS 56 03/05/2011 0939   ALKPHOS 57 10/21/2010 0804   AST 22 08/08/2012 0820   AST 22 03/05/2011 0939   AST 23 10/21/2010 0804    ALT 29 08/08/2012 0820   ALT 20 03/05/2011 0939   ALT 32 10/21/2010 0804   BILITOT 2.64* 08/08/2012 0820   BILITOT 2.2* 03/05/2011 0939   BILITOT 2.30* 10/21/2010 0804        Impression and Plan:  This is a pleasant 32 year old with the following issues.   1.  Presumed  testes cancer diagnosed in 2011.  At this point I really see no evidence of any recurrent disease. Tumor markers and CT scans have been unremarkable.  [name] continues to be vague in terms of his history as given me from his testicular cancer standpoint and I was not able to get the exact pathology and the exact treatment that he has received. If his Ct scan show relapse disease, he will need a biopsy at this time.   2. Lung nodule: based on a CT scan in 09/2011. I will repeat CT scan to follow up on that.   3. 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 His Hgb is normal but his bilirubin slightly high which indicate mild compensated hemolysis.   [facility], MD 7/31/20148:55 AM

## 2012-08-17 ENCOUNTER — Ambulatory Visit (HOSPITAL_COMMUNITY)
Admission: RE | Admit: 2012-08-17 | Discharge: 2012-08-17 | Disposition: A | Discharge status: Home / Self Care | Payer: BC Managed Care – PPO | Source: Ambulatory Visit | Attending: Oncology | Admitting: Oncology

## 2012-08-17 ENCOUNTER — Encounter (HOSPITAL_COMMUNITY): Payer: Self-pay

## 2012-08-17 ENCOUNTER — Telehealth: Payer: Self-pay | Admitting: *Deleted

## 2012-08-17 DIAGNOSIS — R911 Solitary pulmonary nodule: Secondary | ICD-10-CM | POA: Insufficient documentation

## 2012-08-17 DIAGNOSIS — C6290 Malignant neoplasm of unspecified testis, unspecified whether descended or undescended: Secondary | ICD-10-CM | POA: Insufficient documentation

## 2012-08-17 MED ORDER — IOHEXOL 300 MG/ML  SOLN
80.0000 mL | Freq: Once | INTRAMUSCULAR | Status: AC | PRN
  Administered 2012-08-17: 80 mL via INTRAVENOUS

## 2012-08-17 NOTE — Progress Notes (Signed)
Spoke with patient, gave results of recent scan.

## 2012-08-17 NOTE — Telephone Encounter (Signed)
Message copied by Reesa Chew on Wed Aug 17, 2012  2:45 PM ------      Message from: Benjiman Core      Created: Wed Aug 17, 2012  9:55 AM       Please call him and let him know scan is perfectly normal ------

## 2012-11-17 ENCOUNTER — Other Ambulatory Visit: Payer: Self-pay

## 2012-11-24 ENCOUNTER — Encounter: Payer: Self-pay | Admitting: Oncology

## 2012-11-25 ENCOUNTER — Telehealth: Payer: Self-pay | Admitting: *Deleted

## 2012-11-25 NOTE — Telephone Encounter (Signed)
Per dr Clelia Croft, he is to report to urgent care for evaluation.

## 2013-02-10 ENCOUNTER — Telehealth: Payer: Self-pay | Admitting: Oncology

## 2013-02-10 ENCOUNTER — Ambulatory Visit (HOSPITAL_BASED_OUTPATIENT_CLINIC_OR_DEPARTMENT_OTHER): Payer: BC Managed Care – PPO | Admitting: Oncology

## 2013-02-10 ENCOUNTER — Other Ambulatory Visit (HOSPITAL_BASED_OUTPATIENT_CLINIC_OR_DEPARTMENT_OTHER): Payer: BC Managed Care – PPO

## 2013-02-10 ENCOUNTER — Encounter: Payer: Self-pay | Admitting: Oncology

## 2013-02-10 VITALS — BP 138/77 | HR 74 | Temp 98.0°F | Resp 20 | Ht 73.0 in | Wt 255.0 lb

## 2013-02-10 DIAGNOSIS — C6290 Malignant neoplasm of unspecified testis, unspecified whether descended or undescended: Secondary | ICD-10-CM

## 2013-02-10 DIAGNOSIS — Z8547 Personal history of malignant neoplasm of testis: Secondary | ICD-10-CM

## 2013-02-10 LAB — CBC WITH DIFFERENTIAL/PLATELET
BASO%: 0.2 % (ref 0.0–2.0)
Basophils Absolute: 0 10*3/uL (ref 0.0–0.1)
EOS ABS: 0.1 10*3/uL (ref 0.0–0.5)
EOS%: 0.7 % (ref 0.0–7.0)
HEMATOCRIT: 49 % (ref 38.4–49.9)
HGB: 18 g/dL — ABNORMAL HIGH (ref 13.0–17.1)
LYMPH#: 3.5 10*3/uL — AB (ref 0.9–3.3)
LYMPH%: 21.6 % (ref 14.0–49.0)
MCH: 33.8 pg — ABNORMAL HIGH (ref 27.2–33.4)
MCHC: 36.7 g/dL — AB (ref 32.0–36.0)
MCV: 91.9 fL (ref 79.3–98.0)
MONO#: 1.4 10*3/uL — AB (ref 0.1–0.9)
MONO%: 8.5 % (ref 0.0–14.0)
NEUT%: 69 % (ref 39.0–75.0)
NEUTROS ABS: 11.2 10*3/uL — AB (ref 1.5–6.5)
NRBC: 0 % (ref 0–0)
PLATELETS: 508 10*3/uL — AB (ref 140–400)
RBC: 5.33 10*6/uL (ref 4.20–5.82)
RDW: 12.3 % (ref 11.0–14.6)
WBC: 16.2 10*3/uL — AB (ref 4.0–10.3)

## 2013-02-10 LAB — COMPREHENSIVE METABOLIC PANEL (CC13)
ALBUMIN: 4.3 g/dL (ref 3.5–5.0)
ALT: 44 U/L (ref 0–55)
ANION GAP: 11 meq/L (ref 3–11)
AST: 27 U/L (ref 5–34)
Alkaline Phosphatase: 65 U/L (ref 40–150)
BUN: 14.7 mg/dL (ref 7.0–26.0)
CALCIUM: 10 mg/dL (ref 8.4–10.4)
CHLORIDE: 106 meq/L (ref 98–109)
CO2: 23 meq/L (ref 22–29)
CREATININE: 0.9 mg/dL (ref 0.7–1.3)
Glucose: 101 mg/dl (ref 70–140)
POTASSIUM: 4.2 meq/L (ref 3.5–5.1)
Sodium: 140 mEq/L (ref 136–145)
Total Bilirubin: 3.39 mg/dL — ABNORMAL HIGH (ref 0.20–1.20)
Total Protein: 7.9 g/dL (ref 6.4–8.3)

## 2013-02-10 LAB — LACTATE DEHYDROGENASE (CC13): LDH: 183 U/L (ref 125–245)

## 2013-02-10 LAB — TECHNOLOGIST REVIEW

## 2013-02-10 NOTE — Progress Notes (Signed)
Hematology and Oncology Follow Up Visit  [name] [id number] 1980/04/07 33 y.o. 02/10/2013 8:48 AM CC: [name], M.D.    Principle Diagnosis: This is a 33 year old gentleman with diagnosis of a testicular cancer, presumably nonseminomatous testes cancer.  Again presume stage IB diagnosed in 2011.  A lot of the details remained elusive.  At this point current therapy is an active surveillance.  Interim History:  [name] is a pleasant 33 year old gentleman, whom I saw for the first time back in April 2011, was referred here by [name] after he gave a personal history of testicular cancer that was treated out of town.  He has had imaging studies as well as tumor markers and all have been an unremarkable since then.  Since the last time I saw him really he did not report any problems or any complications.  Had not had any health issues.  Did not report any abdominal pain, had not reported any pelvic pain.  Had not reported any masses or lesions.  Overall performance status activity level remains excellent. He is still working full time. No recent illnesses.    Medications: I have reviewed the patient's current medications.  Current Outpatient Prescriptions  Medication Sig Dispense Refill  . oxymetazoline (AFRIN) 0.05 % nasal spray Place 1 spray into the nose 2 (two) times daily.       No current facility-administered medications for this visit.    Allergies:  Allergies  Allergen Reactions  . Codeine     REACTION: hyper    Past Medical History, Surgical history, Social history, and Family History were reviewed and updated.  Review of Systems: Constitutional:  Negative for fever, chills, night sweats, anorexia, weight loss, pain.  Remaining ROS negative. Physical Exam: Blood pressure 138/77, pulse 74, temperature 98 F (36.7 C), temperature source Oral, resp. rate 20, height 6\' 1"  (1.854 m), weight 255 lb (115.667 kg). ECOG: 0 General appearance: alert Head:  Normocephalic, without obvious abnormality, atraumatic Neck: no adenopathy, no carotid bruit, no JVD, supple, symmetrical, trachea midline and thyroid not enlarged, symmetric, no tenderness/mass/nodules Lymph nodes: Cervical, supraclavicular, and axillary nodes normal. Heart:regular rate and rhythm, S1, S2 normal, no murmur, click, rub or gallop Lung:chest clear, no wheezing, rales, normal symmetric air entry Abdomin: soft, non-tender, without masses or organomegaly EXT:no erythema, induration, or nodules   Lab Results: Lab Results  Component Value Date   WBC 8.6 08/08/2012   HGB 16.9 08/08/2012   HCT 45.9 08/08/2012   MCV 92.4 08/08/2012   PLT 519 few clumps not affecting count* 08/08/2012     Chemistry      Component Value Date/Time   NA 140 08/08/2012 0820   NA 141 03/05/2011 0939   NA 143 10/21/2010 0804   K 4.4 08/08/2012 0820   K 4.6 03/05/2011 0939   K 4.6 10/21/2010 0804   CL 105 09/16/2011 0902   CL 106 03/05/2011 0939   CL 103 10/21/2010 0804   CO2 25 08/08/2012 0820   CO2 26 03/05/2011 0939   CO2 27 10/21/2010 0804   BUN 13.2 08/08/2012 0820   BUN 15 03/05/2011 0939   BUN 11 10/21/2010 0804   CREATININE 0.8 08/08/2012 0820   CREATININE 0.86 03/05/2011 0939   CREATININE 0.8 10/21/2010 0804      Component Value Date/Time   CALCIUM 9.6 08/08/2012 0820   CALCIUM 10.2 03/05/2011 0939   CALCIUM 9.4 10/21/2010 0804   ALKPHOS 63 08/08/2012 0820   ALKPHOS 56 03/05/2011 0939  ALKPHOS 57 10/21/2010 0804   AST 22 08/08/2012 0820   AST 22 03/05/2011 0939   AST 23 10/21/2010 0804   ALT 29 08/08/2012 0820   ALT 20 03/05/2011 0939   ALT 32 10/21/2010 0804   BILITOT 2.64* 08/08/2012 0820   BILITOT 2.2* 03/05/2011 0939   BILITOT 2.30* 10/21/2010 0804      Clinical Data: Testicular cancer. Pulmonary nodule at the left  lung base.  CT CHEST WITH CONTRAST  Technique: Multidetector CT imaging of the chest was performed  following the standard protocol during bolus administration of  intravenous  contrast.  Contrast: 42mL OMNIPAQUE IOHEXOL 300 MG/ML SOLN  Comparison: CT scans dated 09/22/2011 and 10/21/2010  Findings: The small pulmonary nodule seen peripherally in the left  lower lobe on the prior study has completely resolved. There is a  1.8 mm nodule in the lateral aspect of the lingula on image number  43 of series 5 which with benefit of retrospection is unchanged  since the prior study although much better defined on the current  exam.  The lungs are otherwise clear. No hilar or mediastinal or axillary  adenopathy. Heart size is normal. No effusions. No significant  osseous abnormality. The visualized portion of the upper abdomen  is normal.  IMPRESSION:  1. Resolution of the small pulmonary nodule in the left lower  lobe.  2. 1.8 mm nodule in the lateral aspect of the lingula, stable  since 10/21/2010 and not felt to be of any significance. No follow-  up of this tiny nodule which has been stable for 22 months.    Impression and Plan:  This is a pleasant 33 year old with the following issues.   1.  Presumed  testes cancer diagnosed in 2011.  At this point I really see no evidence of any recurrent disease. Tumor markers and CT scans have been unremarkable.  [name] continues to be vague in terms of his history as given me from his testicular cancer standpoint and I was not able to get the exact pathology and the exact treatment that he has received. If his Ct scan show relapse disease, he will need a biopsy at this time.   2. Lung nodule: CT scan from 08/17/2012 showed no abnormalities.  3. 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 His Hgb is normal but his bilirubin slightly high which indicate mild compensated hemolysis.   [facility], MD 1/30/20158:48 AM

## 2013-02-10 NOTE — Telephone Encounter (Signed)
gv and printed appt sched and avs for pt for Aug °

## 2013-02-13 LAB — BETA HCG QUANT (REF LAB): Beta hCG, Tumor Marker: <2 m[IU]/mL (ref <5.0)

## 2013-02-13 LAB — AFP TUMOR MARKER: AFP-Tumor Marker: 3.5 ng/mL (ref 0.0–8.0)

## 2013-05-12 DIAGNOSIS — J343 Hypertrophy of nasal turbinates: Secondary | ICD-10-CM

## 2013-05-12 HISTORY — DX: Hypertrophy of nasal turbinates: J34.3

## 2013-05-17 ENCOUNTER — Emergency Department (HOSPITAL_COMMUNITY): Payer: BC Managed Care – PPO

## 2013-05-17 ENCOUNTER — Encounter (HOSPITAL_COMMUNITY): Payer: Self-pay | Admitting: Emergency Medicine

## 2013-05-17 ENCOUNTER — Emergency Department (HOSPITAL_COMMUNITY)
Admission: EM | Admit: 2013-05-17 | Discharge: 2013-05-17 | Disposition: A | Discharge status: Home / Self Care | Payer: BC Managed Care – PPO | Attending: Emergency Medicine | Admitting: Emergency Medicine

## 2013-05-17 DIAGNOSIS — Z862 Personal history of diseases of the blood and blood-forming organs and certain disorders involving the immune mechanism: Secondary | ICD-10-CM | POA: Insufficient documentation

## 2013-05-17 DIAGNOSIS — Y9364 Activity, baseball: Secondary | ICD-10-CM | POA: Insufficient documentation

## 2013-05-17 DIAGNOSIS — Z8547 Personal history of malignant neoplasm of testis: Secondary | ICD-10-CM | POA: Insufficient documentation

## 2013-05-17 DIAGNOSIS — Z79899 Other long term (current) drug therapy: Secondary | ICD-10-CM | POA: Insufficient documentation

## 2013-05-17 DIAGNOSIS — Y92838 Other recreation area as the place of occurrence of the external cause: Secondary | ICD-10-CM

## 2013-05-17 DIAGNOSIS — R040 Epistaxis: Secondary | ICD-10-CM | POA: Insufficient documentation

## 2013-05-17 DIAGNOSIS — S0120XA Unspecified open wound of nose, initial encounter: Secondary | ICD-10-CM | POA: Insufficient documentation

## 2013-05-17 DIAGNOSIS — S0121XA Laceration without foreign body of nose, initial encounter: Secondary | ICD-10-CM

## 2013-05-17 DIAGNOSIS — W219XXA Striking against or struck by unspecified sports equipment, initial encounter: Secondary | ICD-10-CM | POA: Insufficient documentation

## 2013-05-17 DIAGNOSIS — S0990XA Unspecified injury of head, initial encounter: Secondary | ICD-10-CM | POA: Insufficient documentation

## 2013-05-17 DIAGNOSIS — J45909 Unspecified asthma, uncomplicated: Secondary | ICD-10-CM | POA: Insufficient documentation

## 2013-05-17 DIAGNOSIS — R110 Nausea: Secondary | ICD-10-CM | POA: Insufficient documentation

## 2013-05-17 DIAGNOSIS — Y9239 Other specified sports and athletic area as the place of occurrence of the external cause: Secondary | ICD-10-CM | POA: Insufficient documentation

## 2013-05-17 DIAGNOSIS — S022XXA Fracture of nasal bones, initial encounter for closed fracture: Secondary | ICD-10-CM

## 2013-05-17 HISTORY — DX: Laceration without foreign body of nose, initial encounter: S01.21XA

## 2013-05-17 HISTORY — DX: Fracture of nasal bones, initial encounter for closed fracture: S02.2XXA

## 2013-05-17 MED ORDER — HYDROCODONE-ACETAMINOPHEN 5-325 MG PO TABS: 1.0000 | ORAL_TABLET | ORAL | Status: DC | PRN

## 2013-05-17 MED ORDER — MORPHINE SULFATE 4 MG/ML IJ SOLN
4.0000 mg | Freq: Once | INTRAMUSCULAR | Status: AC
  Administered 2013-05-17: 4 mg via INTRAMUSCULAR
  Filled 2013-05-17: qty 1

## 2013-05-17 NOTE — Discharge Instructions (Signed)
Take vicodin as prescribed for severe pain.  Do not drive within four hours of taking this medication (may cause drowsiness or confusion).   Do not blow your nose.  If it bleeds again, hold gentle pressure for 5-10 minutes.  You can spray afrin into the bleeding nostril to constrict the blood vessels if necessary.  Follow up with ENT.  You should return to the ER if you develop worsening headache, blurred vision, change in speech/behavior, excessive drowsiness, uncontrolled nose bleed or 2 or more episodes of vomiting over the next 24 hours.

## 2013-05-17 NOTE — ED Notes (Signed)
Pt states that he was playing 3rd base when he was hit in the face by a hit ball. Nose is bloody and painful. Alert and oriented.

## 2013-05-17 NOTE — ED Provider Notes (Signed)
CSN: [id number]     Arrival date & time 05/17/13  2042 History  This chart was scribed for non-physician practitioner, [name], PA-C, working with [name], MD by [name], ED Scribe. This patient was seen in room WTR8/WTR8 and the patient's care was started at 9:06 PM.    Chief Complaint  Patient presents with  . Facial Injury   The history is provided by the patient. No language interpreter was used.   HPI Comments: [name] is a 33 y.o. male who presents to the Emergency Department complaining of facial injury that occurred today while playing 3rd base. Pt states that he was hit by a softball in the face. He states that he heard a "crunch" in his nose after being hit. He reports associated bloody nose, R nostril pain and severe HA, specifically behind the sinuses and eyes. Pt also reports nausea that he attributes to drainage. He denies LOC, dizziness, and blurred vision. He also denies neck pain. He has not taken any medication for pain.  Last tetanus last winter.  Past Medical History  Diagnosis Date  . Asthma   . Spherocytosis   . Testicular cancer dx'd 11/2009   Past Surgical History  Procedure Laterality Date  . Splenectomy    . Hernia repair    . Testicular ca     History reviewed. No pertinent family history. History  Substance Use Topics  . Smoking status: Never Smoker   . Smokeless tobacco: Former Systems developer    Types: Snuff  . Alcohol Use: Yes    Review of Systems  HENT: Positive for nosebleeds and postnasal drip.   Eyes: Negative for visual disturbance.  Gastrointestinal: Positive for nausea.  Musculoskeletal: Negative for neck pain.  Neurological: Positive for headaches. Negative for dizziness and syncope.  All other systems reviewed and are negative.   Allergies  Codeine  Home Medications   Prior to Admission medications   Medication Sig Start Date End Date Taking? Authorizing Provider  oxymetazoline (AFRIN) 0.05 % nasal  spray Place 1 spray into the nose 2 (two) times daily.    Historical Provider, MD   Triage Vitals: BP 127/70  Pulse 88  Temp(Src) 97.7 F (36.5 C) (Oral)  SpO2 94% Physical Exam  Nursing note and vitals reviewed. Constitutional: He is oriented to person, place, and time. He appears well-developed and well-nourished. No distress.  HENT:  Head: Normocephalic and atraumatic.  No deformity of nose but edema and tenderness at bridge.  1cm horizontal, oozing, subq lac same location.  No active epistaxis or obvious deviation of septum.  Can breath through both nares.  Jaw non-tender and full ROM.  No orbital tenderness or pain w/ EOM movements.   Eyes:  Normal appearance  Neck: Normal range of motion.  Cardiovascular: Normal rate, regular rhythm and intact distal pulses.   Pulmonary/Chest: Effort normal and breath sounds normal.  Musculoskeletal: Normal range of motion.  Tenderness of c-spine.    Neurological: He is alert and oriented to person, place, and time. No sensory deficit. Coordination normal.  CN 3-12 intact.  No nystagmus. 5/5 and equal upper and lower extremity strength.  No past pointing.     Skin: Skin is warm and dry. No rash noted.  Psychiatric: He has a normal mood and affect. His behavior is normal.    ED Course  Procedures (including critical care time) DIAGNOSTIC STUDIES: Oxygen Saturation is 94% on RA, adequate by my interpretation.    COORDINATION OF CARE:  9:14 PM-Discussed treatment plan which includes Afrin and CT with pt at bedside. Pt was also offered medication for pain and pt accepted. Pt agreed to plan.  LACERATION REPAIR Performed by: [name] Authorized by: [name] Consent: Verbal consent obtained. Risks and benefits: risks, benefits and alternatives were discussed Consent given by: patient Patient identity confirmed: provided demographic data Prepped and Draped in normal sterile fashion Wound explored  Laceration  Location: bridge of nose  Laceration Length: 1cm  No Foreign Bodies seen or palpated  Anesthesia: none Irrigation method: lavage Amount of cleaning: standard  Skin closure: dermabond   Patient tolerance: Patient tolerated the procedure well with no immediate complications.   Labs Review Labs Reviewed - No data to display  Imaging Review Ct Maxillofacial Wo Cm  05/17/2013   CLINICAL DATA:  Patient hit in the nose with a softball  EXAM: CT MAXILLOFACIAL WITHOUT CONTRAST  TECHNIQUE: Multidetector CT imaging of the maxillofacial structures was performed. Multiplanar CT image reconstructions were also generated. A small metallic BB was placed on the right temple in order to reliably differentiate right from left.  COMPARISON:  None.  FINDINGS: The globes are intact. The orbital walls are intact. The orbital floors are intact. The maxilla is intact. The mandible is intact. The zygomatic arches are intact. There is a comminuted, mildly displaced fracture of the nasal bones bilaterally. There is buckling of the nasal septum with a nondisplaced fracture. There is soft tissue swelling over the nasal bone. The temporomandibular joints are normal.  There is a small left maxillary sinus mucous retention cyst. The visualized portions of the mastoid sinuses are well aerated.  IMPRESSION: 1. Comminuted, mildly displaced bilateral nasal bone fractures. Buckling of the nasal septum with a nondisplaced fracture.   Electronically Signed   By: [name]   On: 05/17/2013 21:39     EKG Interpretation None      MDM   Final diagnoses:  Fracture of nasal septum  Fracture of nasal bones    Healthy 33yo M presents w/ facial injury.  Hit by softball.  No focal neuro deficits or c-spine tenderness, edema and tenderness bridge of nose, no deviation of septum, able to breath through both nares, no active epistaxis.  CT maxillofacial ordered to r/o sinus fx and is sig for bilateral nasal bone and septal bone  fx.  Results discussed w/ pt.  His mother is a Marine scientist at [employer] and pt will be seen in f/u tomorrow.   Lac cleaned by nursing staff and dermabond applied.  Tetanus up to date. Prescribed vicodin and recommended that he refrain from blowing his nose.  Return precautions discussed.   I personally performed the services described in this documentation, which was scribed in my presence. The recorded information has been reviewed and is accurate.    [name], PA-C 05/17/13 [place], PA-C 05/17/13 [phone]

## 2013-05-17 NOTE — ED Provider Notes (Signed)
Medical screening examination/treatment/procedure(s) were performed by non-physician practitioner and as supervising physician I was immediately available for consultation/collaboration.   EKG Interpretation None       [name], MD 05/17/13 2210

## 2013-05-19 ENCOUNTER — Encounter (HOSPITAL_BASED_OUTPATIENT_CLINIC_OR_DEPARTMENT_OTHER): Payer: Self-pay | Admitting: *Deleted

## 2013-05-19 DIAGNOSIS — K121 Other forms of stomatitis: Secondary | ICD-10-CM

## 2013-05-19 HISTORY — DX: Other forms of stomatitis: K12.1

## 2013-05-19 NOTE — H&P (Signed)
PREOPERATIVE H&P  Chief Complaint: nasal fracture  HPI: [name] is a 33 y.o. male who presents for evaluation of nasal fracture. He was hit in the nose with a softball 4/5 and sustained a nasal septal fracture as seen on CT scan. He has trouble breathing through the left side of his nose. He's taken to the OR for closed reduction of nasal septal fracture and turbinate reductions.  Past Medical History  Diagnosis Date  . Spherocytosis   . GERD (gastroesophageal reflux disease)     TUMS prn  . History of testicular cancer   . Ulcer of mouth 05/19/2013  . Laceration of nose 05/17/2013    dermabond  . Nasal septum fracture 05/17/2013  . Nasal turbinate hypertrophy 05/2013   Past Surgical History  Procedure Laterality Date  . Splenectomy    . Shoulder capsulorrhaphy Right   . Inguinal hernia repair Bilateral     as an infant  . Foot tendon surgery Right     as an infant   History   Social History  . Marital Status: Single    Spouse Name: N/A    Number of Children: N/A  . Years of Education: N/A   Social History Main Topics  . Smoking status: Never Smoker   . Smokeless tobacco: Current User    Types: Snuff  . Alcohol Use: Yes     Comment: socially  . Drug Use: No  . Sexual Activity: None   Other Topics Concern  . None   Social History Narrative  . None   History reviewed. No pertinent family history. Allergies  Allergen Reactions  . Codeine Other (See Comments)    HYPERACTIVITY - AS A CHILD   Prior to Admission medications   Medication Sig Start Date End Date Taking? Authorizing Provider  HYDROcodone-acetaminophen (NORCO/VICODIN) 5-325 MG per tablet Take 1 tablet by mouth every 4 (four) hours as needed for moderate pain. 05/17/13  Yes Catherine E Schinlever, PA-C  Multiple Vitamin (MULTIVITAMIN) tablet Take 1 tablet by mouth daily.   Yes Historical Provider, MD  oxymetazoline (AFRIN) 0.05 % nasal spray Place 1 spray into the nose 2 (two) times daily.   Yes  Historical Provider, MD     Positive ROS: nasal obstruction   All other systems have been reviewed and were otherwise negative with the exception of those mentioned in the HPI and as above.  Physical Exam: There were no vitals filed for this visit.  General: Alert, no acute distress Oral: Normal oral mucosa and tonsils Nasal: Nose deviated to the right with severe septal deviation to the right Neck: No palpable adenopathy or thyroid nodules Ear: Ear canal is clear with normal appearing TMs Cardiovascular: Regular rate and rhythm, no murmur.  Respiratory: Clear to auscultation Neurologic: Alert and oriented x 3   Assessment/Plan: NASAL SEPTAL FRACTURE/TURBINATE HYPERTROPHY Plan for Procedure(s): BILATERAL CLOSED REDUCTION NASAL FRACTURE BILATERAL TURBINATE REDUCTION   Rozetta Nunnery, MD 05/19/2013 5:01 PM nasal

## 2013-05-22 ENCOUNTER — Encounter (HOSPITAL_BASED_OUTPATIENT_CLINIC_OR_DEPARTMENT_OTHER): Payer: Self-pay

## 2013-05-22 ENCOUNTER — Ambulatory Visit (HOSPITAL_BASED_OUTPATIENT_CLINIC_OR_DEPARTMENT_OTHER): Payer: BC Managed Care – PPO | Admitting: Anesthesiology

## 2013-05-22 ENCOUNTER — Encounter (HOSPITAL_BASED_OUTPATIENT_CLINIC_OR_DEPARTMENT_OTHER)
Admission: RE | Disposition: A | Discharge status: Home / Self Care | Payer: Self-pay | Source: Ambulatory Visit | Attending: Otolaryngology

## 2013-05-22 ENCOUNTER — Encounter (HOSPITAL_BASED_OUTPATIENT_CLINIC_OR_DEPARTMENT_OTHER): Payer: BC Managed Care – PPO | Admitting: Anesthesiology

## 2013-05-22 ENCOUNTER — Ambulatory Visit (HOSPITAL_BASED_OUTPATIENT_CLINIC_OR_DEPARTMENT_OTHER)
Admission: RE | Admit: 2013-05-22 | Discharge: 2013-05-22 | Disposition: A | Discharge status: Home / Self Care | Payer: BC Managed Care – PPO | Source: Ambulatory Visit | Attending: Otolaryngology | Admitting: Otolaryngology

## 2013-05-22 DIAGNOSIS — Y9364 Activity, baseball: Secondary | ICD-10-CM | POA: Insufficient documentation

## 2013-05-22 DIAGNOSIS — J343 Hypertrophy of nasal turbinates: Secondary | ICD-10-CM | POA: Insufficient documentation

## 2013-05-22 DIAGNOSIS — W219XXA Striking against or struck by unspecified sports equipment, initial encounter: Secondary | ICD-10-CM | POA: Insufficient documentation

## 2013-05-22 DIAGNOSIS — S022XXA Fracture of nasal bones, initial encounter for closed fracture: Secondary | ICD-10-CM | POA: Insufficient documentation

## 2013-05-22 DIAGNOSIS — K219 Gastro-esophageal reflux disease without esophagitis: Secondary | ICD-10-CM | POA: Insufficient documentation

## 2013-05-22 DIAGNOSIS — Z8547 Personal history of malignant neoplasm of testis: Secondary | ICD-10-CM | POA: Insufficient documentation

## 2013-05-22 HISTORY — PX: TURBINATE REDUCTION: SHX6157

## 2013-05-22 HISTORY — DX: Hypertrophy of nasal turbinates: J34.3

## 2013-05-22 HISTORY — PX: CLOSED REDUCTION NASAL FRACTURE: SHX5365

## 2013-05-22 HISTORY — DX: Gastro-esophageal reflux disease without esophagitis: K21.9

## 2013-05-22 HISTORY — DX: Fracture of nasal bones, initial encounter for closed fracture: S02.2XXA

## 2013-05-22 HISTORY — DX: Personal history of malignant neoplasm of testis: Z85.47

## 2013-05-22 HISTORY — DX: Other forms of stomatitis: K12.1

## 2013-05-22 HISTORY — DX: Laceration without foreign body of nose, initial encounter: S01.21XA

## 2013-05-22 SURGERY — CLOSED REDUCTION, FRACTURE, NASAL BONE
Anesthesia: General | Site: Nose | Laterality: Bilateral

## 2013-05-22 MED ORDER — OXYMETAZOLINE HCL 0.05 % NA SOLN
NASAL | Status: AC
  Filled 2013-05-22: qty 15

## 2013-05-22 MED ORDER — FENTANYL CITRATE 0.05 MG/ML IJ SOLN
INTRAMUSCULAR | Status: DC | PRN
  Administered 2013-05-22 (×2): 50 ug via INTRAVENOUS
  Administered 2013-05-22: 100 ug via INTRAVENOUS

## 2013-05-22 MED ORDER — LIDOCAINE-EPINEPHRINE 1 %-1:100000 IJ SOLN
INTRAMUSCULAR | Status: AC
  Filled 2013-05-22: qty 1

## 2013-05-22 MED ORDER — METHYLPREDNISOLONE ACETATE 80 MG/ML IJ SUSP
INTRAMUSCULAR | Status: AC
  Filled 2013-05-22: qty 1

## 2013-05-22 MED ORDER — SODIUM CHLORIDE 0.9 % IJ SOLN
INTRAMUSCULAR | Status: DC | PRN
  Administered 2013-05-22: 4 mL

## 2013-05-22 MED ORDER — MIDAZOLAM HCL 5 MG/5ML IJ SOLN
INTRAMUSCULAR | Status: DC | PRN
  Administered 2013-05-22: 2 mg via INTRAVENOUS

## 2013-05-22 MED ORDER — PROMETHAZINE HCL 25 MG/ML IJ SOLN
INTRAMUSCULAR | Status: AC
  Filled 2013-05-22: qty 1

## 2013-05-22 MED ORDER — MIDAZOLAM HCL 2 MG/2ML IJ SOLN: 1.0000 mg | INTRAMUSCULAR | Status: DC | PRN

## 2013-05-22 MED ORDER — MIDAZOLAM HCL 2 MG/2ML IJ SOLN
INTRAMUSCULAR | Status: AC
  Filled 2013-05-22: qty 2

## 2013-05-22 MED ORDER — OXYCODONE HCL 5 MG/5ML PO SOLN: 5.0000 mg | Freq: Once | ORAL | Status: DC | PRN

## 2013-05-22 MED ORDER — PROPOFOL 10 MG/ML IV BOLUS
INTRAVENOUS | Status: DC | PRN
  Administered 2013-05-22: 300 mg via INTRAVENOUS

## 2013-05-22 MED ORDER — SUCCINYLCHOLINE CHLORIDE 20 MG/ML IJ SOLN
INTRAMUSCULAR | Status: DC | PRN
  Administered 2013-05-22: 100 mg via INTRAVENOUS

## 2013-05-22 MED ORDER — PROMETHAZINE HCL 25 MG/ML IJ SOLN
6.2500 mg | Freq: Four times a day (QID) | INTRAMUSCULAR | Status: DC | PRN
  Administered 2013-05-22: 6.25 mg via INTRAVENOUS

## 2013-05-22 MED ORDER — HYDROMORPHONE HCL PF 1 MG/ML IJ SOLN
INTRAMUSCULAR | Status: AC
  Filled 2013-05-22: qty 1

## 2013-05-22 MED ORDER — BACITRACIN ZINC 500 UNIT/GM EX OINT
TOPICAL_OINTMENT | CUTANEOUS | Status: AC
  Filled 2013-05-22: qty 1.8

## 2013-05-22 MED ORDER — LACTATED RINGERS IV SOLN
INTRAVENOUS | Status: DC
  Administered 2013-05-22 (×2): via INTRAVENOUS

## 2013-05-22 MED ORDER — FENTANYL CITRATE 0.05 MG/ML IJ SOLN
INTRAMUSCULAR | Status: AC
  Filled 2013-05-22: qty 6

## 2013-05-22 MED ORDER — BACITRACIN ZINC 500 UNIT/GM EX OINT
TOPICAL_OINTMENT | CUTANEOUS | Status: AC
  Filled 2013-05-22: qty 28.35

## 2013-05-22 MED ORDER — MIDAZOLAM HCL 2 MG/ML PO SYRP: 12.0000 mg | ORAL_SOLUTION | Freq: Once | ORAL | Status: DC | PRN

## 2013-05-22 MED ORDER — LIDOCAINE-EPINEPHRINE 1 %-1:100000 IJ SOLN
INTRAMUSCULAR | Status: DC | PRN
  Administered 2013-05-22: 4 mL

## 2013-05-22 MED ORDER — SUCCINYLCHOLINE CHLORIDE 20 MG/ML IJ SOLN
INTRAMUSCULAR | Status: AC
  Filled 2013-05-22: qty 1

## 2013-05-22 MED ORDER — SODIUM CHLORIDE 0.9 % IR SOLN
Status: DC | PRN
  Administered 2013-05-22: 100 mL

## 2013-05-22 MED ORDER — CEFAZOLIN SODIUM-DEXTROSE 2-3 GM-% IV SOLR
INTRAVENOUS | Status: AC
  Filled 2013-05-22: qty 50

## 2013-05-22 MED ORDER — HYDROMORPHONE HCL PF 1 MG/ML IJ SOLN
0.2500 mg | INTRAMUSCULAR | Status: DC | PRN
  Administered 2013-05-22 (×4): 0.5 mg via INTRAVENOUS

## 2013-05-22 MED ORDER — CIPROFLOXACIN-DEXAMETHASONE 0.3-0.1 % OT SUSP
OTIC | Status: AC
  Filled 2013-05-22: qty 7.5

## 2013-05-22 MED ORDER — ONDANSETRON HCL 4 MG/2ML IJ SOLN
INTRAMUSCULAR | Status: DC | PRN
  Administered 2013-05-22: 4 mg via INTRAVENOUS

## 2013-05-22 MED ORDER — FENTANYL CITRATE 0.05 MG/ML IJ SOLN: 50.0000 ug | INTRAMUSCULAR | Status: DC | PRN

## 2013-05-22 MED ORDER — PROPOFOL 10 MG/ML IV BOLUS
INTRAVENOUS | Status: AC
  Filled 2013-05-22: qty 20

## 2013-05-22 MED ORDER — BACITRACIN ZINC 500 UNIT/GM EX OINT
TOPICAL_OINTMENT | CUTANEOUS | Status: DC | PRN
  Administered 2013-05-22: 1 via TOPICAL

## 2013-05-22 MED ORDER — OXYCODONE HCL 5 MG PO TABS: 5.0000 mg | ORAL_TABLET | Freq: Once | ORAL | Status: DC | PRN

## 2013-05-22 MED ORDER — DEXAMETHASONE SODIUM PHOSPHATE 4 MG/ML IJ SOLN
INTRAMUSCULAR | Status: DC | PRN
  Administered 2013-05-22: 10 mg via INTRAVENOUS

## 2013-05-22 MED ORDER — OXYMETAZOLINE HCL 0.05 % NA SOLN
NASAL | Status: DC | PRN
  Administered 2013-05-22: 1 via NASAL

## 2013-05-22 SURGICAL SUPPLY — 44 items
APPLICATOR COTTON TIP 6IN STRL (MISCELLANEOUS) IMPLANT
BENZOIN TINCTURE PRP APPL 2/3 (GAUZE/BANDAGES/DRESSINGS) ×3 IMPLANT
BLADE 15 SAFETY STRL DISP (BLADE) ×3 IMPLANT
BLADE INF TURB ROT M4 2 5PK (BLADE) ×2 IMPLANT
BLADE INF TURB ROT M4 2MM 5PK (BLADE) ×1
CANISTER SUCT 1200ML W/VALVE (MISCELLANEOUS) ×3 IMPLANT
CLOSURE WOUND 1/2 X4 (GAUZE/BANDAGES/DRESSINGS) ×1
COAGULATOR SUCT 8FR VV (MISCELLANEOUS) ×3 IMPLANT
CONT SPEC 4OZ CLIKSEAL STRL BL (MISCELLANEOUS) IMPLANT
COVER MAYO STAND STRL (DRAPES) ×3 IMPLANT
DECANTER SPIKE VIAL GLASS SM (MISCELLANEOUS) IMPLANT
DEPRESSOR TONGUE BLADE STERILE (MISCELLANEOUS) ×3 IMPLANT
DRESSING ADAPTIC 1/2  N-ADH (PACKING) ×3 IMPLANT
DRSG TELFA 3X8 NADH (GAUZE/BANDAGES/DRESSINGS) ×3 IMPLANT
ELECT REM PT RETURN 9FT ADLT (ELECTROSURGICAL) ×3
ELECTRODE REM PT RTRN 9FT ADLT (ELECTROSURGICAL) ×1 IMPLANT
GAUZE VASELINE FOILPK 1/2 X 72 (GAUZE/BANDAGES/DRESSINGS) IMPLANT
GLOVE SS BIOGEL STRL SZ 7.5 (GLOVE) ×1 IMPLANT
GLOVE SUPERSENSE BIOGEL SZ 7.5 (GLOVE) ×2
GLOVE SURG SS PI 7.0 STRL IVOR (GLOVE) ×3 IMPLANT
GOWN STRL REUS W/ TWL LRG LVL3 (GOWN DISPOSABLE) ×1 IMPLANT
GOWN STRL REUS W/TWL LRG LVL3 (GOWN DISPOSABLE) ×2
IV NS 500ML (IV SOLUTION) ×2
IV NS 500ML BAXH (IV SOLUTION) ×1 IMPLANT
MARKER SKIN DUAL TIP RULER LAB (MISCELLANEOUS) IMPLANT
NEEDLE 27GAX1X1/2 (NEEDLE) ×3 IMPLANT
PACK BASIN DAY SURGERY FS (CUSTOM PROCEDURE TRAY) ×3 IMPLANT
PATTIES SURGICAL .5 X3 (DISPOSABLE) ×3 IMPLANT
SHEET MEDIUM DRAPE 40X70 STRL (DRAPES) ×3 IMPLANT
SLEEVE SCD COMPRESS KNEE MED (MISCELLANEOUS) IMPLANT
SPLINT NASAL THERMO PLAST (MISCELLANEOUS) ×3 IMPLANT
SPONGE GAUZE 2X2 8PLY STER LF (GAUZE/BANDAGES/DRESSINGS) ×1
SPONGE GAUZE 2X2 8PLY STRL LF (GAUZE/BANDAGES/DRESSINGS) ×2 IMPLANT
SPONGE GAUZE 4X4 12PLY STER LF (GAUZE/BANDAGES/DRESSINGS) ×3 IMPLANT
STRIP CLOSURE SKIN 1/2X4 (GAUZE/BANDAGES/DRESSINGS) ×2 IMPLANT
SUT CHROMIC 4 0 PS 2 18 (SUTURE) IMPLANT
SUT ETHILON 3 0 PS 1 (SUTURE) IMPLANT
SUT SILK 2 0 FS (SUTURE) ×3 IMPLANT
SYR 3ML 18GX1 1/2 (SYRINGE) IMPLANT
SYR CONTROL 10ML LL (SYRINGE) ×3 IMPLANT
TOWEL OR 17X24 6PK STRL BLUE (TOWEL DISPOSABLE) ×3 IMPLANT
TUBE CONNECTING 20'X1/4 (TUBING) ×1
TUBE CONNECTING 20X1/4 (TUBING) ×2 IMPLANT
YANKAUER SUCT BULB TIP NO VENT (SUCTIONS) ×3 IMPLANT

## 2013-05-22 NOTE — Interval H&P Note (Signed)
History and Physical Interval Note:  05/22/2013 8:21 AM  [name]  has presented today for surgery, with the diagnosis of NASAL SEPTAL FRACTURE/TURBINATE HYPERTROPHY  The various methods of treatment have been discussed with the patient and family. After consideration of risks, benefits and other options for treatment, the patient has consented to  Procedure(s): BILATERAL CLOSED REDUCTION NASAL FRACTURE (Bilateral) BILATERAL TURBINATE REDUCTION (Bilateral) as a surgical intervention .  The patient's history has been reviewed, patient examined, no change in status, stable for surgery.  I have reviewed the patient's chart and labs.  Questions were answered to the patient's satisfaction.     Rozetta Nunnery

## 2013-05-22 NOTE — Anesthesia Procedure Notes (Addendum)
Procedure Name: Intubation Date/Time: 05/22/2013 8:40 AM Performed by: Melynda Ripple D Pre-anesthesia Checklist: Patient identified, Emergency Drugs available, Suction available and Patient being monitored Patient Re-evaluated:Patient Re-evaluated prior to inductionOxygen Delivery Method: Circle System Utilized Preoxygenation: Pre-oxygenation with 100% oxygen Intubation Type: IV induction Ventilation: Mask ventilation without difficulty Laryngoscope Size: Mac and 3 Grade View: Grade I Tube type: Oral Tube size: 8.0 mm Number of attempts: 1 Airway Equipment and Method: stylet and oral airway Placement Confirmation: ETT inserted through vocal cords under direct vision,  positive ETCO2 and breath sounds checked- equal and bilateral Secured at: 24 cm Tube secured with: Tape Dental Injury: Teeth and Oropharynx as per pre-operative assessment

## 2013-05-22 NOTE — Brief Op Note (Signed)
05/22/2013  9:37 AM  PATIENT:  [name]  33 y.o. male  PRE-OPERATIVE DIAGNOSIS:  NASAL SEPTAL FRACTURE/TURBINATE HYPERTROPHY  POST-OPERATIVE DIAGNOSIS:  NASAL SEPTAL FRACTURE/TURBINATE HYPERTROPHY  PROCEDURE:  Procedure(s): BILATERAL CLOSED REDUCTION NASAL FRACTURE (Bilateral) BILATERAL TURBINATE REDUCTION (Bilateral)  SURGEON:  Surgeon(s) and Role:    * Rozetta Nunnery, MD - Primary  PHYSICIAN ASSISTANT:   ASSISTANTS: none   ANESTHESIA:   general  EBL:  Total I/O In: 1000 [I.V.:1000] Out: -   BLOOD ADMINISTERED:none  DRAINS: none   LOCAL MEDICATIONS USED:  LIDOCAINE with EPI  5 cc  SPECIMEN:  No Specimen  DISPOSITION OF SPECIMEN:  PATHOLOGY  COUNTS:  YES  TOURNIQUET:  * No tourniquets in log *  DICTATION: .Other Dictation: Dictation Number [id number]  PLAN OF CARE: Discharge to home after PACU  PATIENT DISPOSITION:  PACU - hemodynamically stable.   Delay start of Pharmacological VTE agent (>24hrs) due to surgical blood loss or risk of bleeding: not applicable

## 2013-05-22 NOTE — Anesthesia Preprocedure Evaluation (Addendum)
Anesthesia Evaluation  Patient identified by MRN, date of birth, ID band Patient awake    Reviewed: Allergy & Precautions, H&P , NPO status , Patient's Chart, lab work & pertinent test results  Airway Mallampati: II TM Distance: >3 FB Neck ROM: Full    Dental no notable dental hx. (+) Teeth Intact, Dental Advisory Given   Pulmonary neg pulmonary ROS,  breath sounds clear to auscultation  Pulmonary exam normal       Cardiovascular negative cardio ROS  Rhythm:Regular Rate:Normal     Neuro/Psych negative neurological ROS  negative psych ROS   GI/Hepatic Neg liver ROS, GERD-  Medicated and Controlled,  Endo/Other  negative endocrine ROS  Renal/GU negative Renal ROS  negative genitourinary   Musculoskeletal   Abdominal   Peds  Hematology negative hematology ROS (+)   Anesthesia Other Findings   Reproductive/Obstetrics negative OB ROS                          Anesthesia Physical Anesthesia Plan  ASA: II  Anesthesia Plan: General   Post-op Pain Management:    Induction: Intravenous  Airway Management Planned: Oral ETT  Additional Equipment:   Intra-op Plan:   Post-operative Plan: Extubation in OR  Informed Consent: I have reviewed the patients History and Physical, chart, labs and discussed the procedure including the risks, benefits and alternatives for the proposed anesthesia with the patient or authorized representative who has indicated his/her understanding and acceptance.   Dental advisory given  Plan Discussed with: CRNA  Anesthesia Plan Comments:         Anesthesia Quick Evaluation

## 2013-05-22 NOTE — Anesthesia Postprocedure Evaluation (Signed)
  Anesthesia Post-op Note  Patient: [name]  Procedure(s) Performed: Procedure(s): BILATERAL CLOSED REDUCTION NASAL FRACTURE (Bilateral) BILATERAL TURBINATE REDUCTION (Bilateral)  Patient Location: PACU  Anesthesia Type:General  Level of Consciousness: awake and alert   Airway and Oxygen Therapy: Patient Spontanous Breathing  Post-op Pain: mild  Post-op Assessment: Post-op Vital signs reviewed, Patient's Cardiovascular Status Stable and Respiratory Function Stable  Post-op Vital Signs: Reviewed  Filed Vitals:   05/22/13 1100  BP: 138/65  Pulse: 56  Temp:   Resp: 18    Complications: No apparent anesthesia complications

## 2013-05-22 NOTE — Discharge Instructions (Addendum)
Take your antibiotic and pain medicine as needed. Return to office Wed at 11:15 to have nasal packs removed   Post Anesthesia Home Care Instructions  Activity: Get plenty of rest for the remainder of the day. A responsible adult should stay with you for 24 hours following the procedure.  For the next 24 hours, DO NOT: -Drive a car -Paediatric nurse -Drink alcoholic beverages -Take any medication unless instructed by your physician -Make any legal decisions or sign important papers.  Meals: Start with liquid foods such as gelatin or soup. Progress to regular foods as tolerated. Avoid greasy, spicy, heavy foods. If nausea and/or vomiting occur, drink only clear liquids until the nausea and/or vomiting subsides. Call your physician if vomiting continues.  Special Instructions/Symptoms: Your throat may feel dry or sore from the anesthesia or the breathing tube placed in your throat during surgery. If this causes discomfort, gargle with warm salt water. The discomfort should disappear within 24 hours.  Post Anesthesia Home Care Instructions  Activity: Get plenty of rest for the remainder of the day. A responsible adult should stay with you for 24 hours following the procedure.  For the next 24 hours, DO NOT: -Drive a car -Paediatric nurse -Drink alcoholic beverages -Take any medication unless instructed by your physician -Make any legal decisions or sign important papers.  Meals: Start with liquid foods such as gelatin or soup. Progress to regular foods as tolerated. Avoid greasy, spicy, heavy foods. If nausea and/or vomiting occur, drink only clear liquids until the nausea and/or vomiting subsides. Call your physician if vomiting continues.  Special Instructions/Symptoms: Your throat may feel dry or sore from the anesthesia or the breathing tube placed in your throat during surgery. If this causes discomfort, gargle with warm salt water. The discomfort should disappear within 24  hours.

## 2013-05-22 NOTE — Transfer of Care (Signed)
Immediate Anesthesia Transfer of Care Note  Patient: [name]  Procedure(s) Performed: Procedure(s): BILATERAL CLOSED REDUCTION NASAL FRACTURE (Bilateral) BILATERAL TURBINATE REDUCTION (Bilateral)  Patient Location: PACU  Anesthesia Type:General  Level of Consciousness: awake and alert   Airway & Oxygen Therapy: Patient Spontanous Breathing and Patient connected to face mask oxygen  Post-op Assessment: Report given to PACU RN and Post -op Vital signs reviewed and stable  Post vital signs: Reviewed and stable  Complications: No apparent anesthesia complications

## 2013-05-23 ENCOUNTER — Encounter (HOSPITAL_BASED_OUTPATIENT_CLINIC_OR_DEPARTMENT_OTHER): Payer: Self-pay | Admitting: Otolaryngology

## 2013-05-23 NOTE — Op Note (Signed)
[name]               ACCOUNT NO.:  [id number]  MEDICAL RECORD NO.:  [id number]  LOCATION:                                 FACILITY:  PHYSICIAN:  Delaplaine Lucia Gaskins, M.D.DATE OF BIRTH:  05/10/1980  DATE OF PROCEDURE:  05/22/2013 DATE OF DISCHARGE:  05/22/2013                              OPERATIVE REPORT   PREOPERATIVE DIAGNOSES: 1. Nasal septal fracture. 2. Turbinate hypertrophy.  POSTOPERATIVE DIAGNOSES: 1. Nasal septal fracture. 2. Turbinate hypertrophy.  OPERATION PERFORMED:  Closed reduction of nasal septal fracture with bilateral inferior turbinate reductions.  SURGEON:  [name]. Lucia Gaskins, M.D.  ANESTHESIA:  General endotracheal.  COMPLICATIONS:  None.  BRIEF CLINICAL NOTE:  [name] is a 33 year old gentleman who recently hit the nose with a soft fall, sustained a comminuted fracture of the nasal bones as well as the septum, which was buckled severely to the right nasal bones, were deviated to the right and were comminuted. The septum had actually also buckled and closed off the right nasal airway.  He had previously fairly good breathing, but had some allergies and some mild nasal congestion, had large turbinates.  He had a severe deviation of the septum to the right.  He had a small laceration over the nasal dorsum that was closed with Dermabond in the ER.  He was taken to the operating room at this time for closed reduction of nasal septal fracture and bilateral inferior turbinate reductions with the Medtronic turbinate blade.  DESCRIPTION OF PROCEDURE:  After adequate endotracheal anesthesia, the patient received 1 g of Ancef IV preoperatively.  Using manual manipulation of butter knife, the nasal bones were elevated and repositioned more toward midline.  On elevation of the nasal bones as well as the septum, the septum improved on the right side.  A butter knife was also reused to remove the posterior septum more toward midline as  well as the anterior septum, which was deviated to the right.  He had a fair amount of bleeding after the elevation and packing with Afrin was used to control hemostasis, and while the Afrin pledgets were placed in the superior aspect of nasal vault area, inferior turbinate reductions were performed.  The turbinates were injected with 3-4 mL mixture of saline and Xylocaine with epinephrine on both inferior turbinates. Then, the Medtronic turbinate blade was used to perform submucosal turbinate reductions.  The turbinate bone was then outfractured and suction cautery was used for hemostasis.  This completed the procedure. Nonadherent 0.5-inch gauze soaked in bacitracin ointment was placed superiorly underneath the nasal bones especially on the right side where he had crushed nasal bones to support the nasal bones as well as control bleeding and then Telfa soaked in bacitracin ointment was placed along the floor of the nose along the inferior turbinates bilaterally.  This completed the procedure.  [name] was awoke from anesthesia and transferred to the recovery room, postop doing well.  DISPOSITION:  [name] was discharged home later this morning on Tylenol, hydrocodone p.r.n. pain., Keflex 500 mg b.i.d. for the next 3-4 days.  I will have him follow up in my office in 2 days for recheck to have nasal packs removed.  ______________________________ [name] Lucia Gaskins, M.D.   ______________________________ [name]. Lucia Gaskins, M.D.    CEN/MEDQ  D:  05/22/2013  T:  05/23/2013  Job:  [id number]

## 2013-09-05 ENCOUNTER — Telehealth: Payer: Self-pay | Admitting: Oncology

## 2013-09-05 ENCOUNTER — Other Ambulatory Visit (HOSPITAL_BASED_OUTPATIENT_CLINIC_OR_DEPARTMENT_OTHER): Payer: BC Managed Care – PPO

## 2013-09-05 ENCOUNTER — Encounter: Payer: Self-pay | Admitting: Oncology

## 2013-09-05 ENCOUNTER — Ambulatory Visit (HOSPITAL_BASED_OUTPATIENT_CLINIC_OR_DEPARTMENT_OTHER): Payer: BC Managed Care – PPO | Admitting: Oncology

## 2013-09-05 VITALS — BP 146/78 | HR 58 | Temp 98.1°F | Resp 20 | Ht 74.0 in | Wt 253.5 lb

## 2013-09-05 DIAGNOSIS — C801 Malignant (primary) neoplasm, unspecified: Secondary | ICD-10-CM

## 2013-09-05 DIAGNOSIS — C6290 Malignant neoplasm of unspecified testis, unspecified whether descended or undescended: Secondary | ICD-10-CM

## 2013-09-05 DIAGNOSIS — D580 Hereditary spherocytosis: Secondary | ICD-10-CM

## 2013-09-05 DIAGNOSIS — R911 Solitary pulmonary nodule: Secondary | ICD-10-CM

## 2013-09-05 LAB — CBC WITH DIFFERENTIAL/PLATELET
BASO%: 0.8 % (ref 0.0–2.0)
BASOS ABS: 0.1 10*3/uL (ref 0.0–0.1)
EOS%: 1.4 % (ref 0.0–7.0)
Eosinophils Absolute: 0.2 10*3/uL (ref 0.0–0.5)
HEMATOCRIT: 47.3 % (ref 38.4–49.9)
HEMOGLOBIN: 17.1 g/dL (ref 13.0–17.1)
LYMPH%: 29.3 % (ref 14.0–49.0)
MCH: 33.7 pg — ABNORMAL HIGH (ref 27.2–33.4)
MCHC: 36.2 g/dL — ABNORMAL HIGH (ref 32.0–36.0)
MCV: 92.9 fL (ref 79.3–98.0)
MONO#: 1.5 10*3/uL — AB (ref 0.1–0.9)
MONO%: 12.3 % (ref 0.0–14.0)
NEUT#: 6.8 10*3/uL — ABNORMAL HIGH (ref 1.5–6.5)
NEUT%: 56.2 % (ref 39.0–75.0)
PLATELETS: 529 10*3/uL — AB (ref 140–400)
RBC: 5.09 10*6/uL (ref 4.20–5.82)
RDW: 13.3 % (ref 11.0–14.6)
WBC: 12 10*3/uL — AB (ref 4.0–10.3)
lymph#: 3.5 10*3/uL — ABNORMAL HIGH (ref 0.9–3.3)

## 2013-09-05 LAB — COMPREHENSIVE METABOLIC PANEL (CC13)
ALT: 34 U/L (ref 0–55)
ANION GAP: 9 meq/L (ref 3–11)
AST: 25 U/L (ref 5–34)
Albumin: 4.1 g/dL (ref 3.5–5.0)
Alkaline Phosphatase: 68 U/L (ref 40–150)
BUN: 14.9 mg/dL (ref 7.0–26.0)
CALCIUM: 9.8 mg/dL (ref 8.4–10.4)
CO2: 25 meq/L (ref 22–29)
CREATININE: 0.8 mg/dL (ref 0.7–1.3)
Chloride: 106 mEq/L (ref 98–109)
Glucose: 92 mg/dl (ref 70–140)
Potassium: 4.3 mEq/L (ref 3.5–5.1)
Sodium: 140 mEq/L (ref 136–145)
Total Bilirubin: 2.21 mg/dL — ABNORMAL HIGH (ref 0.20–1.20)
Total Protein: 7.9 g/dL (ref 6.4–8.3)

## 2013-09-05 LAB — LACTATE DEHYDROGENASE (CC13): LDH: 192 U/L (ref 125–245)

## 2013-09-05 NOTE — Telephone Encounter (Signed)
Pt confirmed labs/ov per 08/25 POF.....gave pt AVS....KJ

## 2013-09-05 NOTE — Progress Notes (Signed)
Hematology and Oncology Follow Up Visit  [name] [id number] [birth year] 32 y.o. 09/05/2013 8:30 AM CC: [name], M.D.    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  She did out-of-town and most of the details are unavailable to me at this point. His imaging studies and tumor markers did not reveal any evidence of recurrence in the last 4 years.  Current therapy: Observation and surveillance.  Interim History:  [name] presents today for a followup visit.  Since the last visit, he has been doing relatively well. He underwent sinus and no surgery after you suffered a trauma being hit by a softball. He had a closed reduction of a nasal fracture and also turbinate reduction. This was done on 05/22/2013 have recovered well since. He does not report any other  health issues.  Did not report any abdominal pain, had not reported any pelvic pain.  Had not reported any masses or lesions.  Overall performance status activity level remains excellent. He is still working full time. No recent illnesses. He did not report any headaches or blurry vision or double vision, he does not report any syncope or seizures. He does not report any chest pain orthopnea or palpitations. Does not report any cough or hemoptysis. Does not report any gross satiety or lymphadenopathy. Does not report any skin rashes or lesions.   Medications: I have reviewed the patient's current medications.  Current Outpatient Prescriptions  Medication Sig Dispense Refill  . Multiple Vitamin (MULTIVITAMIN) tablet Take 1 tablet by mouth daily.      Marland Kitchen HYDROcodone-acetaminophen (NORCO/VICODIN) 5-325 MG per tablet Take 1 tablet by mouth every 4 (four) hours as needed for moderate pain.  20 tablet  0   No current facility-administered medications for this visit.    Allergies:  Allergies   Allergen Reactions  . Codeine Other (See Comments)    HYPERACTIVITY - AS A CHILD    Past Medical History, Surgical history, Social history, and Family History were reviewed and updated.  Review of Systems: Constitutional:  Negative for fever, chills, night sweats, anorexia, weight loss, pain.  Remaining ROS negative. Physical Exam: Blood pressure 146/78, pulse 58, temperature 98.1 F (36.7 C), temperature source Oral, resp. rate 20, height 6\' 2"  (1.88 m), weight 253 lb 8 oz (114.987 kg). ECOG: 0 General appearance: alert Head: Normocephalic, without obvious abnormality Neck: no adenopathy Lymph nodes: Cervical, supraclavicular, and axillary nodes normal. and I could not appreciate any inguinal adenopathy today. Heart:regular rate and rhythm, S1, S2 normal, no murmur, click, rub or gallop Lung:chest clear, no wheezing, rales, normal symmetric air entry Abdomin: soft, non-tender, without masses or organomegaly EXT:no erythema, induration, or nodules   Lab Results: Lab Results  Component Value Date   WBC 12.0* 09/05/2013   HGB 17.1 09/05/2013   HCT 47.3 09/05/2013   MCV 92.9 09/05/2013   PLT 529* 09/05/2013     Chemistry      Component Value Date/Time   NA 140 02/10/2013 0800   NA 141 03/05/2011 0939   NA 143 10/21/2010 0804   K 4.2 02/10/2013 0800   K 4.6 03/05/2011 0939   K 4.6 10/21/2010 0804   CL 105 09/16/2011 0902   CL 106 03/05/2011 0939   CL 103 10/21/2010 0804   CO2 23 02/10/2013 0800   CO2 26 03/05/2011 0939   CO2 27 10/21/2010 0804  BUN 14.7 02/10/2013 0800   BUN 15 03/05/2011 0939   BUN 11 10/21/2010 0804   CREATININE 0.9 02/10/2013 0800   CREATININE 0.86 03/05/2011 0939   CREATININE 0.8 10/21/2010 0804      Component Value Date/Time   CALCIUM 10.0 02/10/2013 0800   CALCIUM 10.2 03/05/2011 0939   CALCIUM 9.4 10/21/2010 0804   ALKPHOS 65 02/10/2013 0800   ALKPHOS 56 03/05/2011 0939   ALKPHOS 57 10/21/2010 0804   AST 27 02/10/2013 0800   AST 22 03/05/2011 0939   AST 23  10/21/2010 0804   ALT 44 02/10/2013 0800   ALT 20 03/05/2011 0939   ALT 32 10/21/2010 0804   BILITOT 3.39* 02/10/2013 0800   BILITOT 2.2* 03/05/2011 0939   BILITOT 2.30* 10/21/2010 0804      Results for [name] (MRN [id number]) as of 09/05/2013 08:18  Ref. Range 02/10/2013 08:00  AFP-Tumor Marker Latest Range: 0.0-8.0 ng/mL 3.5  Beta hCG, Tumor Marker Latest Range: < 5.0 mIU/mL < 2.0   Impression and Plan:  This is a pleasant 33 year old with the following issues.   1.  Presumed  testes cancer diagnosed in 2011. I see no evidence of any recurrent disease. Tumor markers and CT scans have been unremarkable. [name] continues to be vague in terms of his history as given me from his testicular cancer standpoint and I was not able to get the exact pathology and the exact treatment that he has received. I plan on repeating a chest x-ray, tumor markers and a clinical visit in one year in August of 2016. BN that he may not need any further surveillance.  2. Lung nodule: CT scan from 08/17/2012 showed no abnormalities.  3. 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 He has a slight leukocytosis and thrombocytosis related to that.  [facility], MD 8/25/20158:30 AM

## 2013-09-07 LAB — BETA HCG QUANT (REF LAB)

## 2013-09-07 LAB — AFP TUMOR MARKER: AFP TUMOR MARKER: 3.4 ng/mL (ref <6.1)

## 2013-09-25 ENCOUNTER — Other Ambulatory Visit: Payer: Self-pay | Admitting: Occupational Medicine

## 2013-09-25 ENCOUNTER — Ambulatory Visit
Admission: RE | Admit: 2013-09-25 | Discharge: 2013-09-25 | Disposition: A | Discharge status: Home / Self Care | Payer: No Typology Code available for payment source | Source: Ambulatory Visit | Attending: Occupational Medicine | Admitting: Occupational Medicine

## 2013-09-25 DIAGNOSIS — Z021 Encounter for pre-employment examination: Secondary | ICD-10-CM

## 2014-08-17 ENCOUNTER — Telehealth: Payer: Self-pay | Admitting: Oncology

## 2014-08-17 NOTE — Telephone Encounter (Signed)
Patient called in to cancel his appointment for 09/06/14 and does not wish to reschedule

## 2014-09-06 ENCOUNTER — Ambulatory Visit: Payer: BC Managed Care – PPO | Admitting: Oncology

## 2014-09-06 ENCOUNTER — Other Ambulatory Visit: Payer: BC Managed Care – PPO

## 2016-01-06 IMAGING — CT CT MAXILLOFACIAL W/O CM
1 series · 16 of 30 positions shown, 20 images · non-contrast
Comparison: None.

CLINICAL DATA: Patient hit in the nose with a softball

EXAM:
CT MAXILLOFACIAL WITHOUT CONTRAST
TECHNIQUE: Multidetector CT imaging of the maxillofacial structures was
performed. Multiplanar CT image reconstructions were also generated.
A small metallic BB was placed on the right temple in order to
reliably differentiate right from left.

[Series 3: facial st · axial · 0.34mm/px · z∈[-241,-75]mm · 16 of 91 slices shown, 20 images]
[im 4/91  brain]
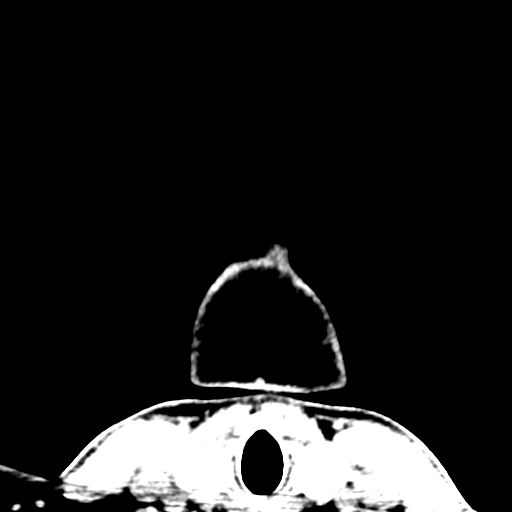
[im 4/91  bone]
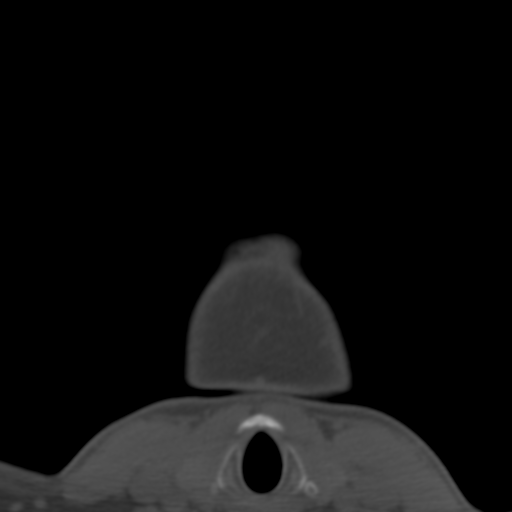
[im 10/91  bone]
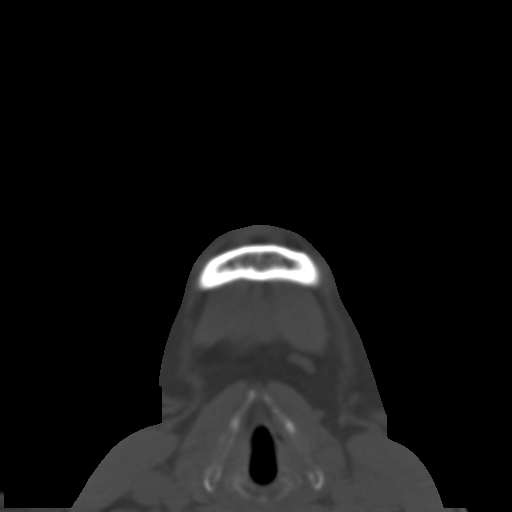
[im 16/91  bone]
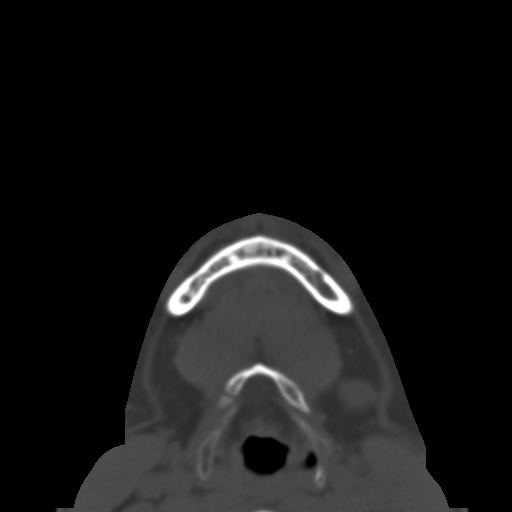
[im 22/91  bone]
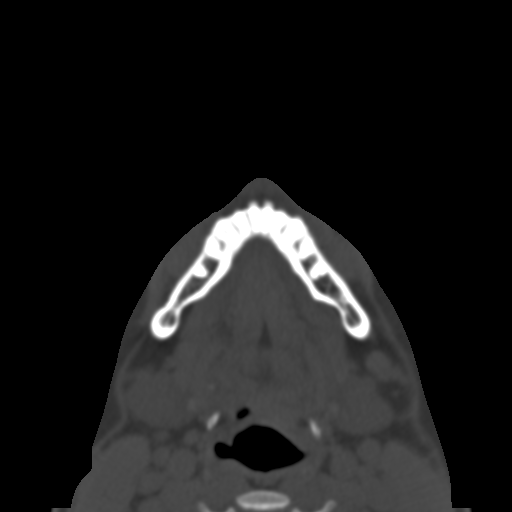
[im 25/91  brain]
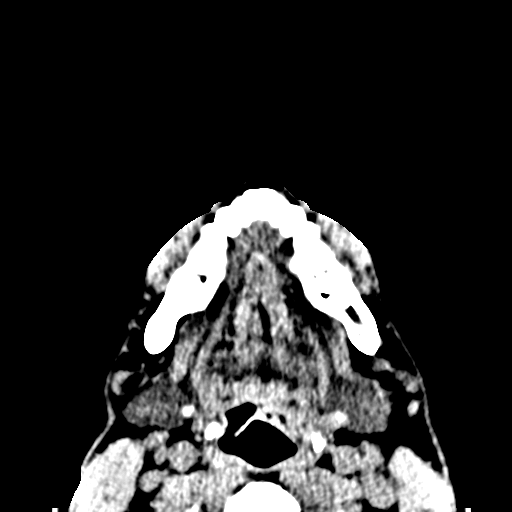
[im 25/91  bone]
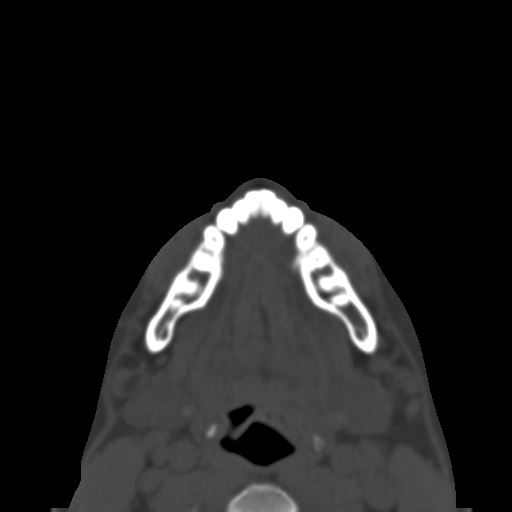
[im 32/91  bone]
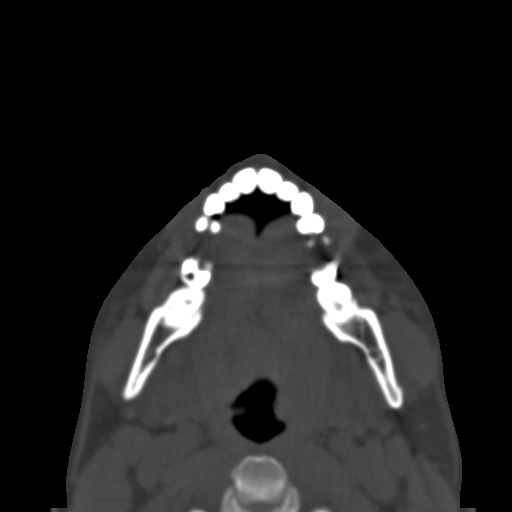
[im 38/91  bone]
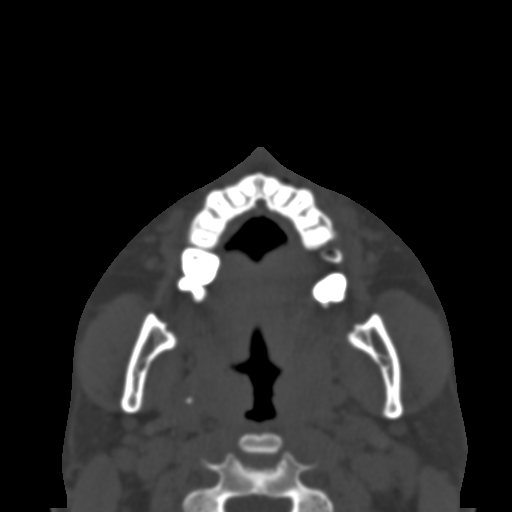
[im 44/91  bone]
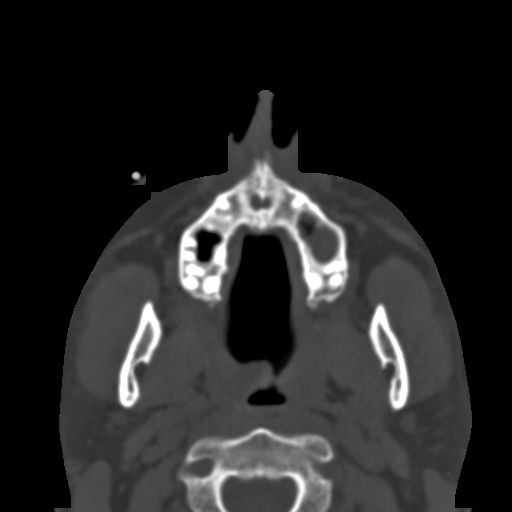
[im 47/91  brain]
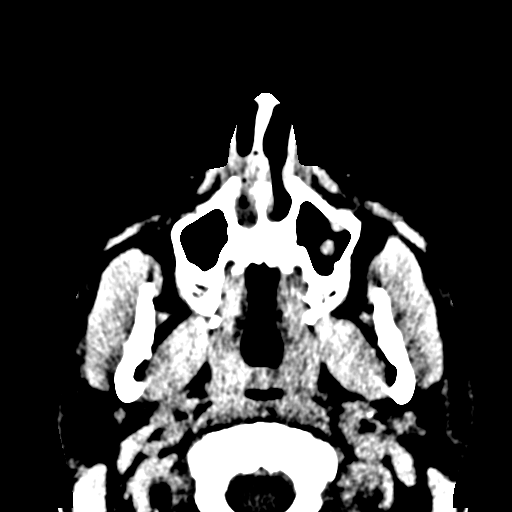
[im 47/91  bone]
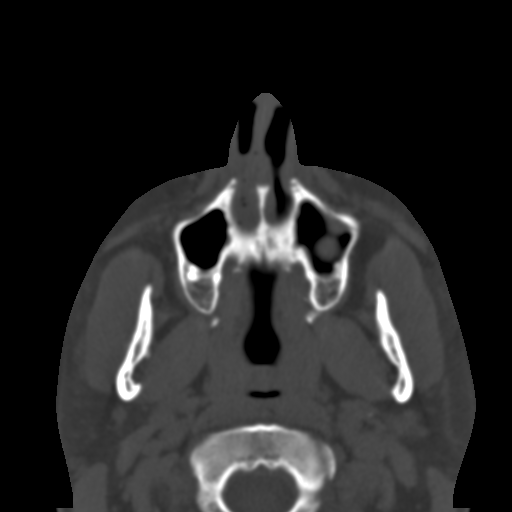
[im 53/91  bone]
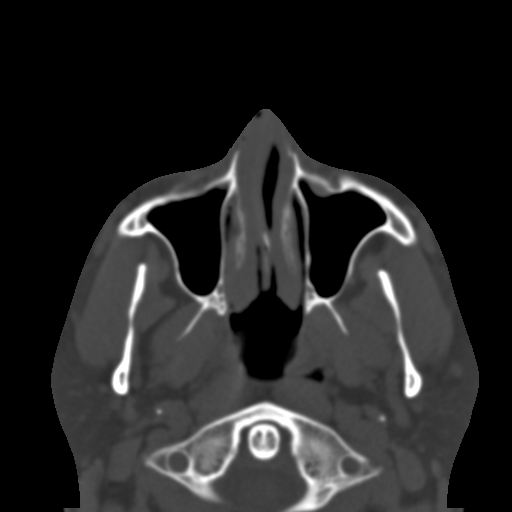
[im 59/91  bone]
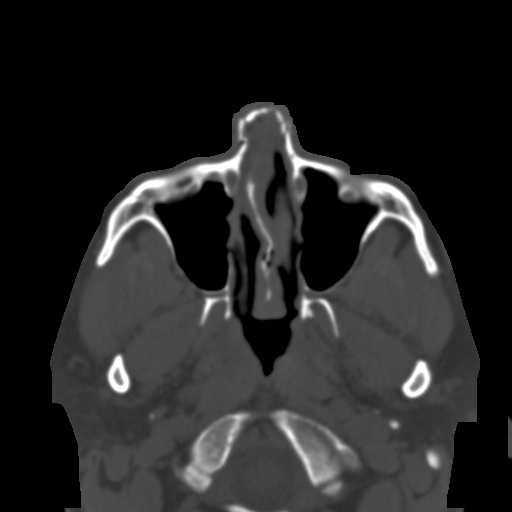
[im 66/91  bone]
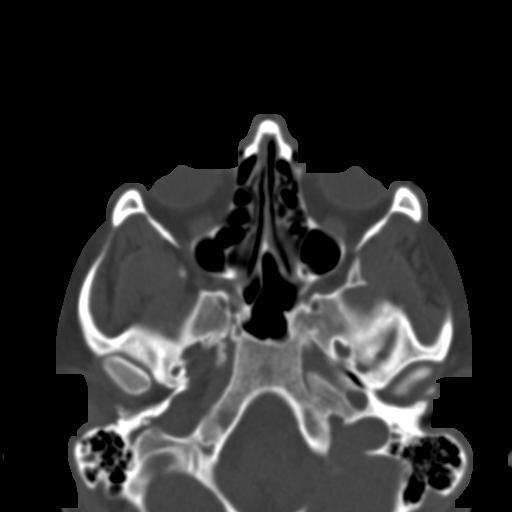
[im 69/91  brain]
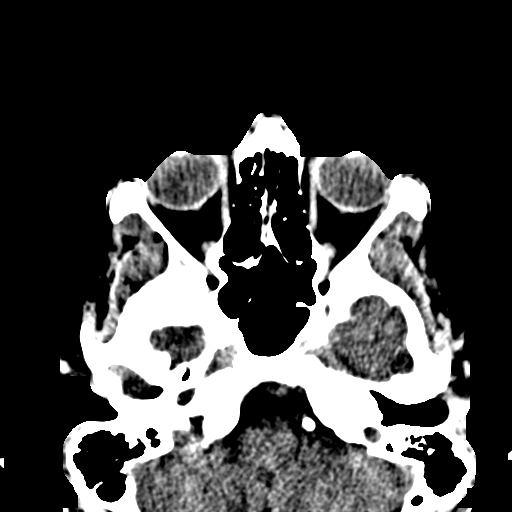
[im 69/91  bone]
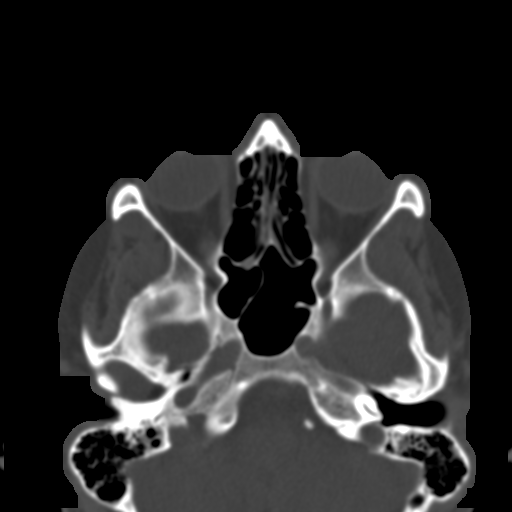
[im 75/91  bone]
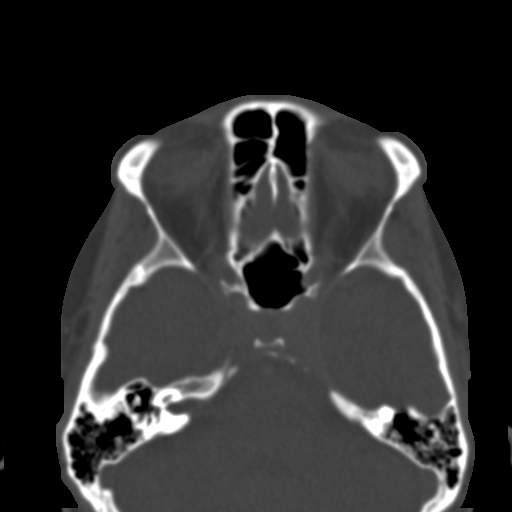
[im 81/91  bone]
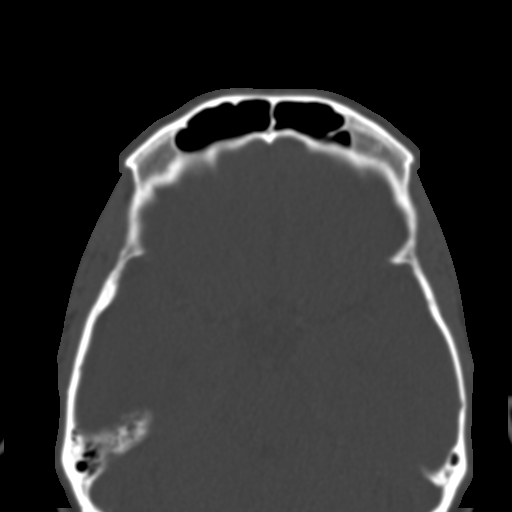
[im 87/91  bone]
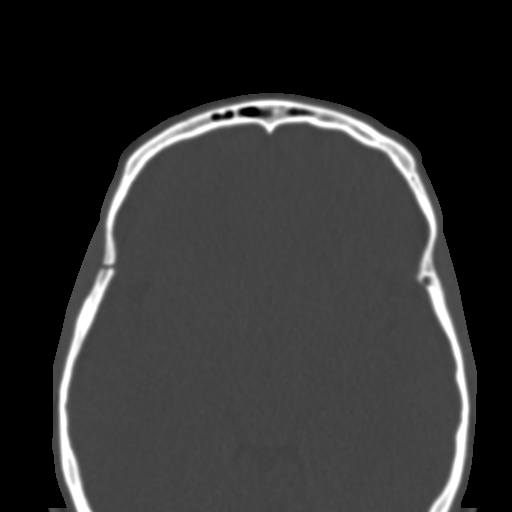

[16 of 30 positions shown; findings below may reference images not displayed]

FINDINGS: The globes are intact. The orbital walls are intact. The orbital
floors are intact. The maxilla is intact. The mandible is intact.
The zygomatic arches are intact. There is a comminuted, mildly
displaced fracture of the nasal bones bilaterally. There is buckling
of the nasal septum with a nondisplaced fracture. There is soft
tissue swelling over the nasal bone. The temporomandibular joints
are normal.

There is a small left maxillary sinus mucous retention cyst. The
visualized portions of the mastoid sinuses are well aerated.
IMPRESSION: 1. Comminuted, mildly displaced bilateral nasal bone fractures.
Buckling of the nasal septum with a nondisplaced fracture.

## 2016-05-16 IMAGING — CR DG CHEST 1V
1 series · 1 of 1 positions shown · non-contrast
Comparison: April 25, 2010 chest radiograph and August 17, 2012
chest CT

CLINICAL DATA: Pre-employment physical examination

EXAM:
CHEST - 1 VIEW

[view not recorded]
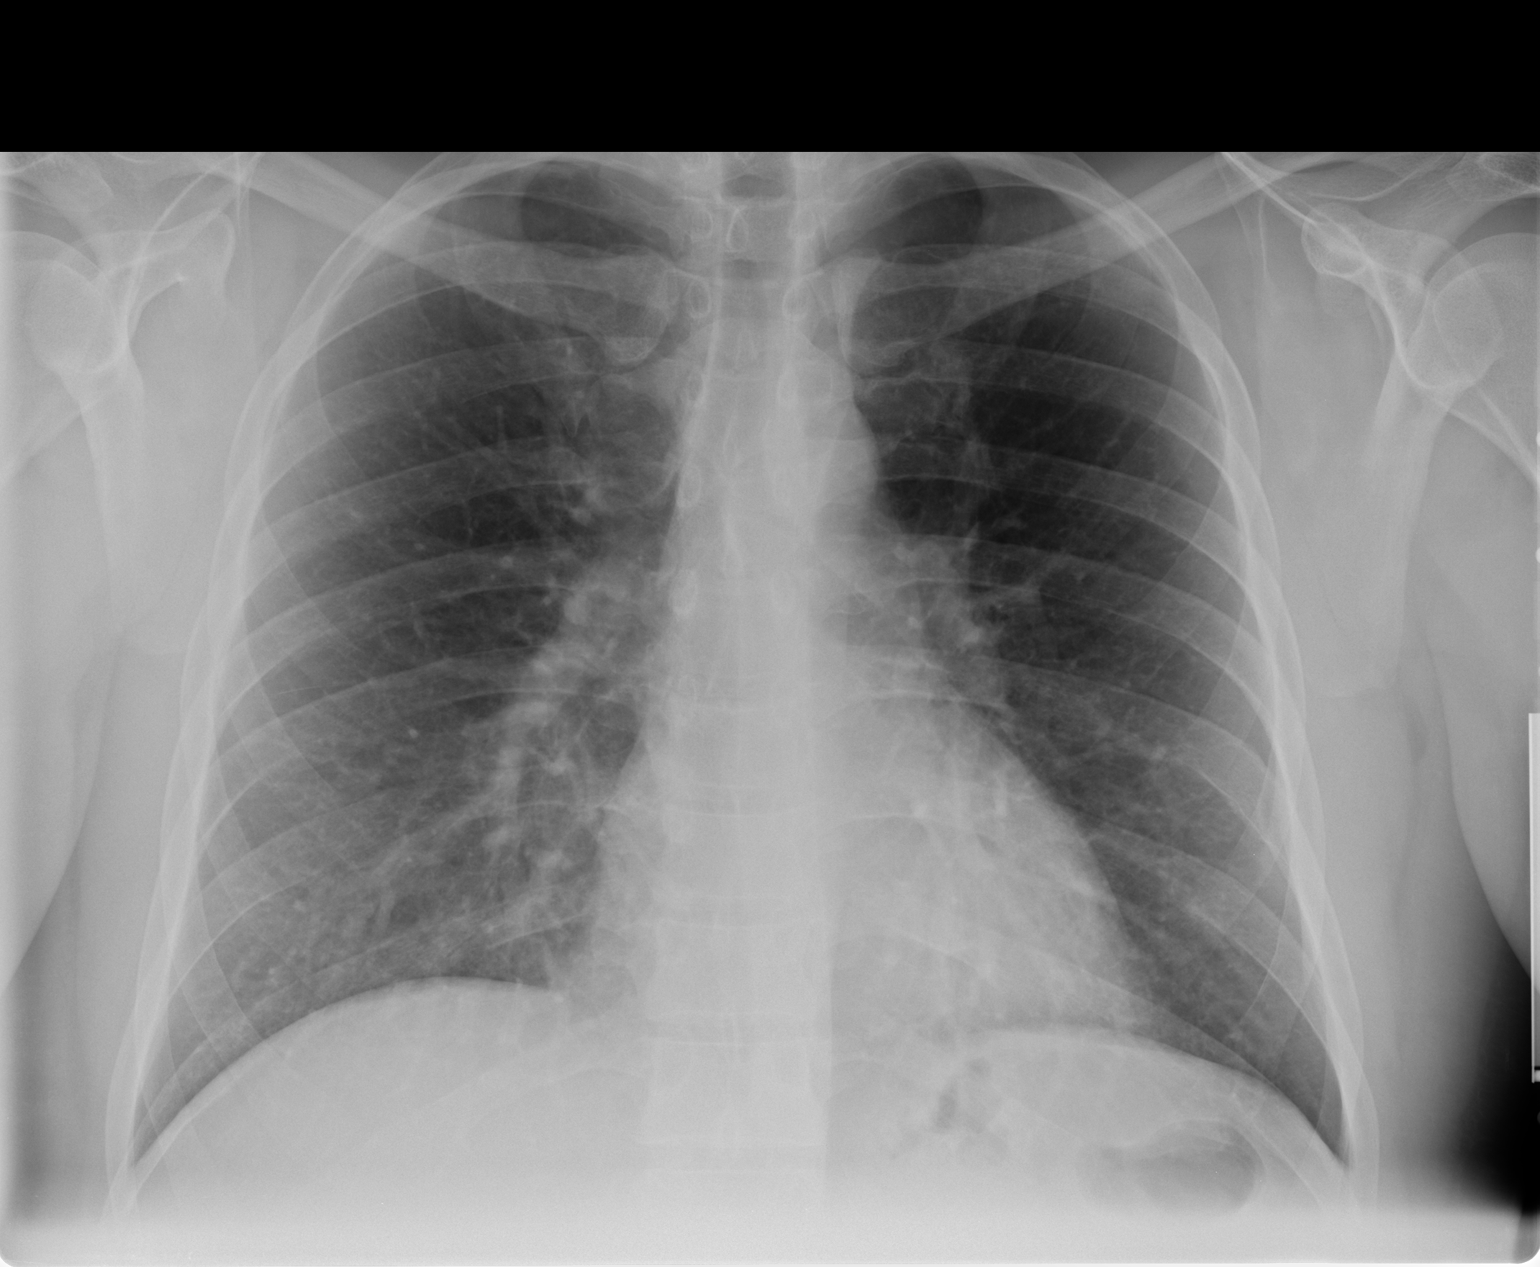

[1 of 1 positions shown; findings below may reference images not displayed]

FINDINGS: Lungs are clear. Heart size and pulmonary vascularity are normal. No
adenopathy. No bone lesions.
IMPRESSION: No edema or consolidation.

## 2017-09-14 NOTE — Progress Notes (Signed)
Pt states he has to reschedule surgery and just spoke with Dr. Jennell Corner office. 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 States he will reschedule in a few weeks.

## 2017-09-17 ENCOUNTER — Other Ambulatory Visit (INDEPENDENT_AMBULATORY_CARE_PROVIDER_SITE_OTHER): Payer: Self-pay | Admitting: Family Medicine

## 2017-09-17 ENCOUNTER — Telehealth (INDEPENDENT_AMBULATORY_CARE_PROVIDER_SITE_OTHER): Payer: Self-pay | Admitting: Family Medicine

## 2017-09-17 MED ORDER — AZITHROMYCIN 250 MG PO TABS: ORAL_TABLET | ORAL | 0 refills | Status: DC

## 2017-09-17 NOTE — Telephone Encounter (Signed)
[name] is a former patient of mine who plans to establish care again in the near future.  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  I will call in an antibiotic for him.  He will follow-up in the near future.  He will go to a local urgent care if symptoms do not improve.

## 2017-09-20 ENCOUNTER — Ambulatory Visit (HOSPITAL_BASED_OUTPATIENT_CLINIC_OR_DEPARTMENT_OTHER): Admit: 2017-09-20 | Payer: 59 | Admitting: Otolaryngology

## 2017-09-20 ENCOUNTER — Encounter (HOSPITAL_BASED_OUTPATIENT_CLINIC_OR_DEPARTMENT_OTHER): Payer: Self-pay

## 2017-09-20 SURGERY — TONSILLECTOMY
Anesthesia: General

## 2017-12-06 ENCOUNTER — Ambulatory Visit (INDEPENDENT_AMBULATORY_CARE_PROVIDER_SITE_OTHER): Payer: 59 | Admitting: Family Medicine

## 2017-12-06 ENCOUNTER — Encounter (INDEPENDENT_AMBULATORY_CARE_PROVIDER_SITE_OTHER): Payer: Self-pay | Admitting: Family Medicine

## 2017-12-06 DIAGNOSIS — M25511 Pain in right shoulder: Secondary | ICD-10-CM

## 2017-12-06 DIAGNOSIS — M25561 Pain in right knee: Secondary | ICD-10-CM | POA: Diagnosis not present

## 2017-12-06 DIAGNOSIS — M25512 Pain in left shoulder: Secondary | ICD-10-CM

## 2017-12-06 DIAGNOSIS — M25562 Pain in left knee: Secondary | ICD-10-CM

## 2017-12-06 DIAGNOSIS — G8929 Other chronic pain: Secondary | ICD-10-CM

## 2017-12-06 DIAGNOSIS — M546 Pain in thoracic spine: Secondary | ICD-10-CM

## 2017-12-06 MED ORDER — VITAMIN D-3 125 MCG (5000 UT) PO TABS
1.0000 | ORAL_TABLET | Freq: Every day | ORAL | 3 refills | Status: DC
Start: 2017-12-06 — End: 2020-01-17

## 2017-12-06 MED ORDER — TIZANIDINE HCL 2 MG PO TABS: 2.0000 mg | ORAL_TABLET | Freq: Every evening | ORAL | 1 refills | Status: DC | PRN

## 2017-12-06 MED ORDER — ETODOLAC 400 MG PO TABS: 400.0000 mg | ORAL_TABLET | Freq: Two times a day (BID) | ORAL | 3 refills | Status: DC | PRN

## 2017-12-06 NOTE — Progress Notes (Signed)
Office Visit Note   Patient: [name]           Date of Birth: [birth year]           MRN: [id number] Visit Date: 12/06/2017 Requested by: Alroy Dust, L.Marlou Sa, [place] [address] [employer] [place], [place] [address] PCP: Eunice Blase, MD  Subjective: Chief Complaint  Patient presents with  . bilateral shoulder, right knee pain    HPI: He is a 37 year old with bilateral shoulder, bilateral knee pain, and thoracic back pain.  Shoulders have been bothering him intermittently for the past couple years.  His knees have been bothering him as well.  Shoulders hurt on the posterior lateral aspect when reaching overhead or when lying on his side.  He is status post right shoulder reconstruction in high school for posterior dislocation.  He did well for a while.  His knees hurt when he squats and pivots.  Sometimes the right knee locks up and he has to manipulate it to get it to straighten out again.  This happens maybe once every 3 months.  His thoracic back has been hurting in the midline intermittently for the past few months, no injury.                ROS: He is otherwise in good health.  Other systems were noncontributory.  Objective: Vital Signs: There were no vitals taken for this visit.  Physical Exam:  Shoulders: Full active range of motion.  Tender in the posterior subacromial space of each shoulder.  No tenderness at the [facility] joint or biceps tendon.  He has pain with empty can test on both sides but good rotator cuff strength, no pain with internal/external rotation. Knees: No effusion in either knee, no significant patella apprehension or compression test.  No tenderness over the quadriceps or patellar tendons.  Slight tenderness on the posterior lateral joint line of the right knee but no palpable click with McMurray's. Thoracic spine: There is some tenderness in the region of the T8-10 area midline.  No distinct trigger points palpable.  Imaging: None today.  Assessment  & Plan: 1.  Chronic bilateral shoulder pain, suspect rotator cuff impingement -Medications given, home exercises given, physical therapy referral.  X-rays and MRI scan if symptoms persist.  2.  Bilateral knee pain, right greater than left, with occasional locking concerning for meniscus pathology -Medicines and physical therapy, MRI if symptoms worsen.  3.  Thoracic back pain -Treatment as above.  He also plans to try chiropractic since he has done this in the past with good results.  X-rays if symptoms persist.   Follow-Up Instructions: No follow-ups on file.      Procedures: No procedures performed  No notes on file    PMFS History: Patient Active Problem List   Diagnosis Date Noted  . HYPERTENSION 11/28/2009  . DYSPNEA 11/28/2009   Past Medical History:  Diagnosis Date  . GERD (gastroesophageal reflux disease)    TUMS prn  . History of testicular cancer   . Laceration of nose 05/17/2013   dermabond  . Nasal septum fracture 05/17/2013  . Nasal turbinate hypertrophy 05/2013  . Spherocytosis ([place])   . Ulcer of mouth 05/19/2013    History reviewed. No pertinent family history.  Past Surgical History:  Procedure Laterality Date  . CLOSED REDUCTION NASAL FRACTURE Bilateral 05/22/2013   Procedure: BILATERAL CLOSED REDUCTION NASAL FRACTURE;  Surgeon: Rozetta Nunnery, MD;  Location: [facility];  Service: ENT;  Laterality: Bilateral;  .  FOOT TENDON SURGERY Right    as an infant  . INGUINAL HERNIA REPAIR Bilateral    as an infant  . SHOULDER CAPSULORRHAPHY Right   . SPLENECTOMY    . TURBINATE REDUCTION Bilateral 05/22/2013   Procedure: BILATERAL TURBINATE REDUCTION;  Surgeon: Rozetta Nunnery, MD;  Location: [facility];  Service: ENT;  Laterality: Bilateral;   Social History   Occupational History  . Not on file  Tobacco Use  . Smoking status: Never Smoker  . Smokeless tobacco: Current User    Types: Snuff  Substance and Sexual  Activity  . Alcohol use: Yes    Comment: socially  . Drug use: No  . Sexual activity: Not on file

## 2018-02-28 ENCOUNTER — Other Ambulatory Visit (INDEPENDENT_AMBULATORY_CARE_PROVIDER_SITE_OTHER): Payer: Self-pay | Admitting: Family Medicine

## 2018-02-28 DIAGNOSIS — M79671 Pain in right foot: Secondary | ICD-10-CM

## 2018-02-28 DIAGNOSIS — M79672 Pain in left foot: Principal | ICD-10-CM

## 2018-02-28 NOTE — Progress Notes (Signed)
Having bilateral heel pain.  Will try PT.  Could try custom orthotics or injection if worsens.

## 2018-03-10 ENCOUNTER — Ambulatory Visit (INDEPENDENT_AMBULATORY_CARE_PROVIDER_SITE_OTHER): Payer: 59 | Admitting: Family Medicine

## 2018-03-10 ENCOUNTER — Encounter (INDEPENDENT_AMBULATORY_CARE_PROVIDER_SITE_OTHER): Payer: Self-pay | Admitting: Family Medicine

## 2018-03-10 DIAGNOSIS — M79672 Pain in left foot: Secondary | ICD-10-CM

## 2018-03-10 DIAGNOSIS — M79671 Pain in right foot: Secondary | ICD-10-CM

## 2018-03-10 DIAGNOSIS — M65341 Trigger finger, right ring finger: Secondary | ICD-10-CM

## 2018-03-10 DIAGNOSIS — M65342 Trigger finger, left ring finger: Secondary | ICD-10-CM

## 2018-03-10 DIAGNOSIS — R5383 Other fatigue: Secondary | ICD-10-CM | POA: Diagnosis not present

## 2018-03-10 MED ORDER — METHYLPREDNISOLONE ACETATE 40 MG/ML IJ SUSP: 20.0000 mg | Freq: Once | INTRAMUSCULAR | Status: DC

## 2018-03-10 NOTE — Progress Notes (Signed)
Office Visit Note   Patient: [name]           Date of Birth: 1980-08-09           MRN: [id number] Visit Date: 03/10/2018 Requested by: Eunice Blase, MD [address] [place], [place] [address] PCP: Eunice Blase, MD  Subjective: Chief Complaint  Patient presents with  . Right Little Finger - Pain  . Right Foot - Pain    Pain plantar heel - stretches bid are not helping. Not had a chance to go to PT yet.  . Left Foot - Pain    Pain plantar heel.    HPI: He is here with several concerns.  He has a right fifth trigger finger that is locking every time he flexes it.  Splinting has not made much difference.  Continues to have bilateral heel pain despite stretching multiple times per day and using a variety of arch supports.  For the past 2 months he has been very tired in the afternoons, even after getting a good night sleep.               ROS: Otherwise noncontributory  Objective: Vital Signs: There were no vitals taken for this visit.  Physical Exam:  Right hand: His fifth finger has a tender nodule at the A1 pulley and triggers in flexion. Heels: Bilateral tenderness at the medial origin of the plantar fascia.   Imaging: None today.  Assessment & Plan: 1.  Right fifth trigger finger -Discussed options with him and elected to inject with cortisone today.  Follow-up as needed.  2.  Fatigue, etiology uncertain. -Labs to evaluate.  3.  Bilateral heel pain most likely plantar fasciitis -Physical therapy referral for class 4 laser.     Procedures: Right fifth finger injection: After sterile prep with Betadine, injected 2 cc 1% lidocaine without epinephrine and 20 mg methylprednisolone into the A1 pulley region without complication.    PMFS History: Patient Active Problem List   Diagnosis Date Noted  . HYPERTENSION 11/28/2009  . DYSPNEA 11/28/2009   Past Medical History:  Diagnosis Date  . GERD (gastroesophageal reflux disease)    TUMS prn  .  History of testicular cancer   . Laceration of nose 05/17/2013   dermabond  . Nasal septum fracture 05/17/2013  . Nasal turbinate hypertrophy 05/2013  . Spherocytosis ([facility])   . Ulcer of mouth 05/19/2013    History reviewed. No pertinent family history.  Past Surgical History:  Procedure Laterality Date  . CLOSED REDUCTION NASAL FRACTURE Bilateral 05/22/2013   Procedure: BILATERAL CLOSED REDUCTION NASAL FRACTURE;  Surgeon: Rozetta Nunnery, MD;  Location: [facility];  Service: ENT;  Laterality: Bilateral;  . FOOT TENDON SURGERY Right    as an infant  . INGUINAL HERNIA REPAIR Bilateral    as an infant  . SHOULDER CAPSULORRHAPHY Right   . SPLENECTOMY    . TURBINATE REDUCTION Bilateral 05/22/2013   Procedure: BILATERAL TURBINATE REDUCTION;  Surgeon: Rozetta Nunnery, MD;  Location: [facility];  Service: ENT;  Laterality: Bilateral;   Social History   Occupational History  . Not on file  Tobacco Use  . Smoking status: Never Smoker  . Smokeless tobacco: Current User    Types: Snuff  Substance and Sexual Activity  . Alcohol use: Yes    Comment: socially  . Drug use: No  . Sexual activity: Not on file

## 2018-03-11 ENCOUNTER — Telehealth (INDEPENDENT_AMBULATORY_CARE_PROVIDER_SITE_OTHER): Payer: Self-pay | Admitting: Family Medicine

## 2018-03-11 ENCOUNTER — Encounter (INDEPENDENT_AMBULATORY_CARE_PROVIDER_SITE_OTHER): Payer: Self-pay | Admitting: Family Medicine

## 2018-03-11 DIAGNOSIS — Z9081 Acquired absence of spleen: Secondary | ICD-10-CM | POA: Insufficient documentation

## 2018-03-11 DIAGNOSIS — Z8547 Personal history of malignant neoplasm of testis: Secondary | ICD-10-CM | POA: Insufficient documentation

## 2018-03-11 DIAGNOSIS — D580 Hereditary spherocytosis: Secondary | ICD-10-CM | POA: Insufficient documentation

## 2018-03-11 LAB — THYROID PANEL WITH TSH
Free Thyroxine Index: 1.8 (ref 1.4–3.8)
T3 Uptake: 34 % (ref 22–35)
T4 TOTAL: 5.4 ug/dL (ref 4.9–10.5)
TSH: 0.28 mIU/L — ABNORMAL LOW (ref 0.40–4.50)

## 2018-03-11 LAB — COMPREHENSIVE METABOLIC PANEL
AG Ratio: 1.6 (calc) (ref 1.0–2.5)
ALT: 34 U/L (ref 9–46)
AST: 26 U/L (ref 10–40)
Albumin: 4.7 g/dL (ref 3.6–5.1)
Alkaline phosphatase (APISO): 63 U/L (ref 36–130)
BUN: 17 mg/dL (ref 7–25)
CO2: 24 mmol/L (ref 20–32)
Calcium: 10.4 mg/dL — ABNORMAL HIGH (ref 8.6–10.3)
Chloride: 102 mmol/L (ref 98–110)
Creat: 0.82 mg/dL (ref 0.60–1.35)
GLUCOSE: 88 mg/dL (ref 65–99)
Globulin: 3 g/dL (calc) (ref 1.9–3.7)
Potassium: 4.7 mmol/L (ref 3.5–5.3)
SODIUM: 138 mmol/L (ref 135–146)
Total Bilirubin: 2.4 mg/dL — ABNORMAL HIGH (ref 0.2–1.2)
Total Protein: 7.7 g/dL (ref 6.1–8.1)

## 2018-03-11 LAB — CBC WITH DIFFERENTIAL/PLATELET
Absolute Monocytes: 1053 cells/uL — ABNORMAL HIGH (ref 200–950)
Basophils Absolute: 73 cells/uL (ref 0–200)
Basophils Relative: 0.9 %
EOS ABS: 97 {cells}/uL (ref 15–500)
Eosinophils Relative: 1.2 %
HCT: 48.1 % (ref 38.5–50.0)
Hemoglobin: 17.5 g/dL — ABNORMAL HIGH (ref 13.2–17.1)
Lymphs Abs: 2300 cells/uL (ref 850–3900)
MCH: 34.8 pg — ABNORMAL HIGH (ref 27.0–33.0)
MCHC: 36.4 g/dL — AB (ref 32.0–36.0)
MCV: 95.6 fL (ref 80.0–100.0)
MONOS PCT: 13 %
MPV: 10 fL (ref 7.5–12.5)
NEUTROS PCT: 56.5 %
Neutro Abs: 4577 cells/uL (ref 1500–7800)
PLATELETS: 613 10*3/uL — AB (ref 140–400)
RBC: 5.03 10*6/uL (ref 4.20–5.80)
RDW: 12.7 % (ref 11.0–15.0)
TOTAL LYMPHOCYTE: 28.4 %
WBC: 8.1 10*3/uL (ref 3.8–10.8)

## 2018-03-11 LAB — TESTOSTERONE TOTAL,FREE,BIO, MALES
Albumin: 4.7 g/dL (ref 3.6–5.1)
SEX HORMONE BINDING: 40 nmol/L (ref 10–50)
Testosterone, Bioavailable: 107.2 ng/dL — ABNORMAL LOW (ref >=110.0)
Testosterone, Free: 50 pg/mL (ref 46.0–224.0)
Testosterone: 448 ng/dL (ref 250–827)

## 2018-03-11 LAB — VITAMIN D 25 HYDROXY (VIT D DEFICIENCY, FRACTURES): VIT D 25 HYDROXY: 44 ng/mL (ref 30–100)

## 2018-03-11 NOTE — Telephone Encounter (Signed)
Testosterone levels look good.  Nothing to explain symptoms.

## 2018-03-11 NOTE — Telephone Encounter (Signed)
Thyroid TSH borderline-low, but other thyroid numbers look good.  Typically a low TSH is associated with hypERthyroidism (not hypothyroidism), but I don't think that's going on with you.  I'd suggest rechecking thyroid in 3-4 months if still having symptoms.  CBC numbers are abnormal due to spherocytosis/splenectomy, but all are stable.  Testosterone still pending.  Others look good so far.

## 2018-05-25 ENCOUNTER — Encounter: Payer: Self-pay | Admitting: Family Medicine

## 2018-05-25 ENCOUNTER — Ambulatory Visit (INDEPENDENT_AMBULATORY_CARE_PROVIDER_SITE_OTHER): Payer: 59 | Admitting: Family Medicine

## 2018-05-25 ENCOUNTER — Other Ambulatory Visit: Payer: Self-pay

## 2018-05-25 DIAGNOSIS — M65342 Trigger finger, left ring finger: Secondary | ICD-10-CM

## 2018-05-25 DIAGNOSIS — Z113 Encounter for screening for infections with a predominantly sexual mode of transmission: Secondary | ICD-10-CM

## 2018-05-25 MED ORDER — DICLOFENAC SODIUM 1 % TD GEL: 4.0000 g | Freq: Four times a day (QID) | TRANSDERMAL | 6 refills | Status: DC | PRN

## 2018-05-25 NOTE — Progress Notes (Signed)
   Office Visit Note   Patient: [name]           Date of Birth: Jun 28, 1980           MRN: [id number] Visit Date: 05/25/2018 Requested by: Eunice Blase, MD [address] [place], [place] [address] PCP: Eunice Blase, MD  Subjective: Chief Complaint  Patient presents with  . Right Little Finger - Follow-up    Cortisone injection for trigger finger lasted up until 2-3 weeks ago.    HPI: He is here with recurrent right fifth trigger finger.  Symptoms completely resolved for the past couple months but started to return a couple weeks ago.  It is not triggering as badly as before but he would like to treated before he gets worse.  He also requests STD testing.  His fiance has had troubles with bacterial vaginosis.  Patient is asymptomatic, he just wants to be sure.               ROS: No fevers or chills.  All other systems were reviewed and are negative.  Objective: Vital Signs: There were no vitals taken for this visit.  Physical Exam:  General:  Alert and oriented, in no acute distress. Pulm:  Breathing unlabored. Psy:  Normal mood, congruent affect. Skin: No rash on his skin. Right hand: There is a tender nodule at the A1 pulley of the fifth finger.  Full range of motion with minimal triggering.  Imaging: None today.  Assessment & Plan: 1.  Recurrent right fifth trigger finger -Topical diclofenac, ice applications.  Occupational/hand therapy or repeat cortisone injection if symptoms persist.  2.  STD screening     Procedures: No procedures performed  No notes on file     PMFS History: Patient Active Problem List   Diagnosis Date Noted  . Hereditary spherocytosis ([facility]) 03/11/2018  . 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 History of testicular cancer 03/11/2018  . HYPERTENSION 11/28/2009  . DYSPNEA 11/28/2009   Past Medical History:  Diagnosis Date  . GERD (gastroesophageal reflux disease)    TUMS prn  . History of testicular cancer   . Laceration of  nose 05/17/2013   dermabond  . Nasal septum fracture 05/17/2013  . Nasal turbinate hypertrophy 05/2013  . Spherocytosis ([facility])   . Ulcer of mouth 05/19/2013    History reviewed. No pertinent family history.  Past Surgical History:  Procedure Laterality Date  . CLOSED REDUCTION NASAL FRACTURE Bilateral 05/22/2013   Procedure: BILATERAL CLOSED REDUCTION NASAL FRACTURE;  Surgeon: Rozetta Nunnery, MD;  Location: [facility];  Service: ENT;  Laterality: Bilateral;  . FOOT TENDON SURGERY Right    as an infant  . INGUINAL HERNIA REPAIR Bilateral    as an infant  . SHOULDER CAPSULORRHAPHY Right   . SPLENECTOMY    . TURBINATE REDUCTION Bilateral 05/22/2013   Procedure: BILATERAL TURBINATE REDUCTION;  Surgeon: Rozetta Nunnery, MD;  Location: [facility];  Service: ENT;  Laterality: Bilateral;   Social History   Occupational History  . Not on file  Tobacco Use  . Smoking status: Never Smoker  . Smokeless tobacco: Current User    Types: Snuff  Substance and Sexual Activity  . Alcohol use: Yes    Comment: socially  . Drug use: No  . Sexual activity: Not on file

## 2018-05-27 LAB — RPR: RPR Ser Ql: NONREACTIVE

## 2018-05-27 LAB — URINALYSIS, ROUTINE W REFLEX MICROSCOPIC
Bilirubin Urine: NEGATIVE
Glucose, UA: NEGATIVE
Hgb urine dipstick: NEGATIVE
Ketones, ur: NEGATIVE
Leukocytes,Ua: NEGATIVE
Nitrite: NEGATIVE
Protein, ur: NEGATIVE
Specific Gravity, Urine: 1.024 (ref 1.001–1.03)
pH: 6 (ref 5.0–8.0)

## 2018-05-27 LAB — HIV ANTIBODY (ROUTINE TESTING W REFLEX): HIV 1&2 Ab, 4th Generation: NONREACTIVE

## 2018-05-27 LAB — CHLAMYDIA TRACHOMATIS AB
C. TRACHOMATIS AB (IGA): <1:16 {titer}
C. trachomatis IgG: <1:64 {titer}
C. trachomatis IgM: <1:10 {titer}

## 2018-05-27 LAB — URINE CULTURE
MICRO NUMBER:: 470676
Result:: NO GROWTH
SPECIMEN QUALITY:: ADEQUATE

## 2018-06-09 ENCOUNTER — Telehealth: Payer: Self-pay

## 2018-06-09 NOTE — Telephone Encounter (Signed)
Can you advise me on this please?  Patient called stating that he was billed a specialist office visit instead of a primary care visit.  I gave him the number to billing to call, but he was advised to call us back.   Please advise.  Thank you.

## 2018-07-18 ENCOUNTER — Ambulatory Visit (INDEPENDENT_AMBULATORY_CARE_PROVIDER_SITE_OTHER): Payer: 59 | Admitting: Family Medicine

## 2018-07-18 ENCOUNTER — Encounter: Payer: Self-pay | Admitting: Family Medicine

## 2018-07-18 ENCOUNTER — Other Ambulatory Visit: Payer: Self-pay

## 2018-07-18 DIAGNOSIS — M25561 Pain in right knee: Secondary | ICD-10-CM

## 2018-07-18 DIAGNOSIS — G8929 Other chronic pain: Secondary | ICD-10-CM | POA: Diagnosis not present

## 2018-07-18 NOTE — Progress Notes (Signed)
Office Visit Note   Patient: [name]           Date of Birth: Sep 03, 1980           MRN: [id number] Visit Date: 07/18/2018 Requested by: Eunice Blase, MD [address] [place],  [place] [address] PCP: Eunice Blase, MD  Subjective: Chief Complaint  Patient presents with  . Right Knee - Pain    Pain flared up pushing bass boat off the trailer.    HPI: He is here with right knee pain.  He has had troubles with both knees for several years, but the right one bothers him more.  This weekend it flared up substantially to the point that he could hardly bear weight on it.  He states that it usually bothers him when it is bent with weight on it such as going downstairs or squatting.  Sometimes it feels like it gets stuck temporarily and then when it pops, he feels some relief.  He is using Lodine for inflammation with some improvement.              ROS: No fevers or chills.  All other systems were reviewed and are negative.  Objective: Vital Signs: There were no vitals taken for this visit.  Physical Exam:  General:  Alert and oriented, in no acute distress. Pulm:  Breathing unlabored. Psy:  Normal mood, congruent affect. Skin: No rash or erythema. Right knee: No effusion, full range of motion.  Negative patella apprehension test.  1+ patellofemoral crepitus.  There is some tenderness at the quadriceps tendon attachment on the patella on the medial side of the tendon and also a little bit of tenderness at the proximal patellar tendon.  Maximum pain is when I push the patella proximally against the femoral groove.  Lockman's is solid, no joint line tenderness.  Imaging: Musculoskeletal ultrasound right knee: There are some mild tendinopathy changes in quadriceps tendon with small intrasubstance tears.  Patella tendon looks good, there is a little more fluid than usual in the infrapatellar bursa but no hyperemia on power Doppler imaging.   Assessment & Plan: 1.  Chronic  intermittent right greater than left knee pain, exam and history are concerning for patellofemoral articular cartilage injury -Patient would very much like to avoid surgery if at all possible.  We will try Cho-Pat strap during activity.  Straight leg raises for strengthening.  If symptoms worsen, consider x-rays with sunrise view and MRI scan.  Depending on those results, could contemplate prolotherapy with dextrose prior to considering surgery.     Procedures: No procedures performed  No notes on file     PMFS History: Patient Active Problem List   Diagnosis Date Noted  . Hereditary spherocytosis ([facility]) 03/11/2018  . 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 History of testicular cancer 03/11/2018  . HYPERTENSION 11/28/2009  . DYSPNEA 11/28/2009   Past Medical History:  Diagnosis Date  . GERD (gastroesophageal reflux disease)    TUMS prn  . History of testicular cancer   . Laceration of nose 05/17/2013   dermabond  . Nasal septum fracture 05/17/2013  . Nasal turbinate hypertrophy 05/2013  . Spherocytosis ([facility])   . Ulcer of mouth 05/19/2013    History reviewed. No pertinent family history.  Past Surgical History:  Procedure Laterality Date  . CLOSED REDUCTION NASAL FRACTURE Bilateral 05/22/2013   Procedure: BILATERAL CLOSED REDUCTION NASAL FRACTURE;  Surgeon: Rozetta Nunnery, MD;  Location: [facility];  Service: ENT;  Laterality:  Bilateral;  . FOOT TENDON SURGERY Right    as an infant  . INGUINAL HERNIA REPAIR Bilateral    as an infant  . SHOULDER CAPSULORRHAPHY Right   . SPLENECTOMY    . TURBINATE REDUCTION Bilateral 05/22/2013   Procedure: BILATERAL TURBINATE REDUCTION;  Surgeon: Rozetta Nunnery, MD;  Location: [facility];  Service: ENT;  Laterality: Bilateral;   Social History   Occupational History  . Not on file  Tobacco Use  . Smoking status: Never Smoker  . Smokeless tobacco: Current User    Types: Snuff  Substance and Sexual Activity   . Alcohol use: Yes    Comment: socially  . Drug use: No  . Sexual activity: Not on file

## 2018-09-20 ENCOUNTER — Other Ambulatory Visit: Payer: Self-pay | Admitting: Family Medicine

## 2019-03-01 ENCOUNTER — Other Ambulatory Visit: Payer: Self-pay

## 2019-03-01 ENCOUNTER — Ambulatory Visit: Payer: 59 | Admitting: Family Medicine

## 2019-03-01 ENCOUNTER — Other Ambulatory Visit: Payer: Self-pay | Admitting: Family Medicine

## 2019-03-01 ENCOUNTER — Encounter: Payer: Self-pay | Admitting: Family Medicine

## 2019-03-01 DIAGNOSIS — M546 Pain in thoracic spine: Secondary | ICD-10-CM | POA: Diagnosis not present

## 2019-03-01 MED ORDER — METAXALONE 800 MG PO TABS: 800.0000 mg | ORAL_TABLET | Freq: Three times a day (TID) | ORAL | 3 refills | Status: DC | PRN
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
## 2019-07-18 ENCOUNTER — Other Ambulatory Visit: Payer: Self-pay | Admitting: Family Medicine

## 2019-07-18 MED ORDER — ALBUTEROL SULFATE HFA 108 (90 BASE) MCG/ACT IN AERS: 2.0000 | INHALATION_SPRAY | Freq: Four times a day (QID) | RESPIRATORY_TRACT | 3 refills | Status: DC | PRN

## 2019-07-18 MED ORDER — AZITHROMYCIN 250 MG PO TABS: ORAL_TABLET | ORAL | 0 refills | Status: DC

## 2019-12-05 ENCOUNTER — Other Ambulatory Visit: Payer: Self-pay | Admitting: Family Medicine

## 2019-12-05 DIAGNOSIS — R0683 Snoring: Secondary | ICD-10-CM

## 2019-12-05 NOTE — Progress Notes (Signed)
Pt has long-standing history of snoring.  [name] is concerned about sleep apnea.  Will request sleep study.

## 2020-01-17 ENCOUNTER — Ambulatory Visit: Payer: 59 | Admitting: Neurology

## 2020-01-17 ENCOUNTER — Encounter: Payer: Self-pay | Admitting: Neurology

## 2020-01-17 VITALS — BP 152/84 | HR 89 | Ht 74.0 in | Wt 275.0 lb

## 2020-01-17 DIAGNOSIS — R0683 Snoring: Secondary | ICD-10-CM | POA: Diagnosis not present

## 2020-01-17 DIAGNOSIS — E669 Obesity, unspecified: Secondary | ICD-10-CM

## 2020-01-17 DIAGNOSIS — Z9189 Other specified personal risk factors, not elsewhere classified: Secondary | ICD-10-CM

## 2020-01-17 DIAGNOSIS — R0681 Apnea, not elsewhere classified: Secondary | ICD-10-CM | POA: Diagnosis not present

## 2020-01-17 DIAGNOSIS — Z820 Family history of epilepsy and other diseases of the nervous system: Secondary | ICD-10-CM

## 2020-01-17 DIAGNOSIS — R635 Abnormal weight gain: Secondary | ICD-10-CM

## 2020-01-17 NOTE — Patient Instructions (Signed)

## 2020-01-17 NOTE — Progress Notes (Signed)
Subjective:    [name] is a 40 y.o. male.  HPI     Jonathan Age, MD, PhD [name] [address] [name], [name] [address]  Dear Dr. Junius [name],   I saw your [name], Jonathan [name], upon your kind request, in my Sleep clinic today for initial consultation of his sleep disorder, in particular, concern for underlying obstructive sleep apnea.  Jonathan [name] is unaccompanied today.  As you know, Jonathan [name] is a 40 year old right-handed gentleman with an underlying medical history of reflux disease, spherocytosis, back pain, and mild obesity, who reports snoring and excessive daytime somnolence as well as witnessed apneas per fianc.  I reviewed your recent office records. His Epworth sleepiness score is 6 out of 24, fatigue severity score 31 out of 63.  His mom has sleep apnea and uses a CPAP machine.  He occasionally makes gasping sounds but he is not aware of it and he is not aware of his snoring which can be loud per fianc.  He lives with his fiance, they have no children, no pets in Jonathan household currently.  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  He has colleagues with CPAP machines at work.  He would be willing to try CPAP therapy.  He would like to avoid any surgical treatment.  He has had issues with tonsillitis and tonsillar swelling and a couple of years ago did contemplate tonsillectomy but decided against it.  He goes to bed when he is not on-call around 930 or 10 and rise time around 6:30 AM.  He does not have night to night nocturia or recurrent morning headaches.  He likes to drink caffeine in Jonathan form of multiple cups of coffee particularly when he is at work and 1 energy drink in Jonathan morning, diet green tea as well.  He does not smoke any cigarettes, he used to dip tobacco but stopped.  He drinks alcohol socially, occasionally during Jonathan week, not daily.  He has gained weight in Jonathan recent past.  When he is able to lose  weight, his snoring is less.  Some years ago he tried an over-Jonathan-counter mouthguard and did not like it.  His Past Medical History Is Significant For: Past Medical History:  Diagnosis Date  . GERD (gastroesophageal reflux disease)    TUMS prn  . History of testicular cancer   . Laceration of nose 05/17/2013   dermabond  . Nasal septum fracture 05/17/2013  . Nasal turbinate hypertrophy 05/2013  . Spherocytosis ([name])   . Ulcer of mouth 05/19/2013    His Past Surgical History Is Significant For: Past Surgical History:  Procedure Laterality Date  . CLOSED REDUCTION NASAL FRACTURE Bilateral 05/22/2013   Procedure: BILATERAL CLOSED REDUCTION NASAL FRACTURE;  Surgeon: Rozetta Nunnery, MD;  Location: [facility];  Service: ENT;  Laterality: Bilateral;  . FOOT TENDON SURGERY Right    as an infant  . INGUINAL HERNIA REPAIR Bilateral    as an infant  . SHOULDER CAPSULORRHAPHY Right   . SPLENECTOMY    . TURBINATE REDUCTION Bilateral 05/22/2013   Procedure: BILATERAL TURBINATE REDUCTION;  Surgeon: Rozetta Nunnery, MD;  Location: [facility];  Service: ENT;  Laterality: Bilateral;    His Family History Is Significant For: No family history on file.  His Social History Is Significant For: Social History   Socioeconomic History  . Marital status: Single    Spouse name: Not on file  .  Number of children: Not on file  . Years of education: Not on file  . Highest education level: Not on file  Occupational History  . Not on file  Tobacco Use  . Smoking status: Never Smoker  . Smokeless tobacco: Current User    Types: Snuff  Substance and Sexual Activity  . Alcohol use: Yes    Comment: socially  . Drug use: No  . Sexual activity: Not on file  Other Topics Concern  . Not on file  Social History Narrative  . Not on file   Social Determinants of Health   Financial Resource Strain: Not on file  Food Insecurity: Not on file  Transportation Needs: Not  on file  Physical Activity: Not on file  Stress: Not on file  Social Connections: Not on file    His Allergies Are:  Allergies  Allergen Reactions  . Codeine Other (See Comments)    HYPERACTIVITY - AS A CHILD  :   His Current Medications Are:  Outpatient Encounter Medications as of 01/17/2020  Medication Sig  . etodolac (LODINE) 400 MG tablet Take 1 tablet (400 mg total) by mouth 2 (two) times daily as needed.  . Multiple Vitamin (MULTIVITAMIN) tablet Take 1 tablet by mouth daily.  . [DISCONTINUED] albuterol (VENTOLIN HFA) 108 (90 Base) MCG/ACT inhaler Inhale 2 puffs into Jonathan lungs every 6 (six) hours as needed for wheezing or shortness of breath.  . [DISCONTINUED] azithromycin (ZITHROMAX) 250 MG tablet 2 PO x 1 day, then 1 PO qd x 4 more days.  . [DISCONTINUED] Cholecalciferol (VITAMIN D-3) 125 MCG (5000 UT) TABS Take 1 tablet by mouth daily.  . [DISCONTINUED] diclofenac sodium (VOLTAREN) 1 % GEL Apply 4 g topically 4 (four) times daily as needed.  . [DISCONTINUED] metaxalone (SKELAXIN) 800 MG tablet Take 1 tablet (800 mg total) by mouth 3 (three) times daily as needed for muscle spasms.   Facility-Administered Encounter Medications as of 01/17/2020  Medication  . methylPREDNISolone acetate (DEPO-MEDROL) injection 20 mg  :  Review of Systems:  Out of a complete 14 point review of systems, all are reviewed and negative with Jonathan exception of these symptoms as listed below: Review of Systems  Neurological:       Pt presents today to discuss his sleep. Pt has never had a sleep study but does endorse snoring.  Epworth Sleepiness Scale 0= would never doze 1= slight chance of dozing 2= moderate chance of dozing 3= high chance of dozing  Sitting and reading: 0 Watching TV: 0 Sitting inactive in a public place (ex. Theater or meeting): 0 As a passenger in a car for an hour without a break: 2 Lying down to rest in Jonathan afternoon: 2 Sitting and talking to someone: 0 Sitting quietly  after lunch (no alcohol): 0 In a car, while stopped in traffic: 2 Total: 6     Objective:  Neurological Exam  Physical Exam Physical Examination:   Vitals:   01/17/20 0839  BP: (!) 152/84  Pulse: 89    General Examination: Jonathan [name] is a very pleasant 40 y.o. male in no acute distress. He appears well-developed and well-nourished and well groomed.   HEENT: Normocephalic, atraumatic, pupils are equal, round and reactive to light, extraocular tracking is good without limitation to gaze excursion or nystagmus noted. Hearing is grossly intact. Face is symmetric with normal facial animation. Speech is clear with no dysarthria noted. There is no hypophonia. There is no lip, neck/head, jaw or voice tremor. Neck  is supple with full range of passive and active motion. There are no carotid bruits on auscultation. Oropharynx exam reveals: mild mouth dryness, good dental hygiene and mild airway crowding, due to small airway entry and tonsillar size of 2-3+ on Jonathan right and 2+ on Jonathan left.  Uvula normal in size, Mallampati class I.  Tongue protrudes centrally in palate elevates symmetrically. Neck circumference of 18 inches.  Chest: Clear to auscultation without wheezing, rhonchi or crackles noted.  Heart: S1+S2+0, regular and normal without murmurs, rubs or gallops noted.   Abdomen: Soft, non-tender and non-distended with normal bowel sounds appreciated on auscultation.  Extremities: There is no pitting edema in Jonathan distal lower extremities bilaterally.   Skin: Warm and dry without trophic changes noted.   Musculoskeletal: exam reveals no obvious joint deformities, tenderness or joint swelling or erythema.   Neurologically:  Mental status: Jonathan [name] is awake, alert and oriented in all 4 spheres. His immediate and remote memory, attention, language skills and fund of knowledge are appropriate. There is no evidence of aphasia, agnosia, apraxia or anomia. Speech is clear with normal prosody  and enunciation. Thought process is linear. Mood is normal and affect is normal.  Cranial nerves II - XII are as described above under HEENT exam.  Motor exam: Normal bulk, strength and tone is noted. There is no tremor, Romberg is negative. Fine motor skills and coordination: grossly intact.  Cerebellar testing: No dysmetria or intention tremor. There is no truncal or gait ataxia.  Sensory exam: intact to light touch in Jonathan upper and lower extremities.  Gait, station and balance: He stands easily. No veering to one side is noted. No leaning to one side is noted. Posture is [name]-appropriate and stance is narrow based. Gait shows normal stride length and normal pace. No problems turning are noted. Tandem walk is unremarkable.                Assessment and Plan:   In summary, Jonathan [name] is a very pleasant 40 y.o.-year old male with an underlying medical history of reflux disease, spherocytosis, back pain, and mild obesity, whose history and physical exam concerning for obstructive sleep apnea (OSA). I had a long chat with Jonathan [name] about my findings and Jonathan diagnosis of OSA, its prognosis and treatment options. We talked about medical treatments, surgical interventions and non-pharmacological approaches. I explained in particular Jonathan risks and ramifications of untreated moderate to severe OSA, especially with respect to developing cardiovascular disease down Jonathan Road, including congestive heart failure, difficult to treat hypertension, cardiac arrhythmias, or stroke. Even type 2 diabetes has, in part, been linked to untreated OSA. Symptoms of untreated OSA include daytime sleepiness, memory problems, mood irritability and mood disorder such as depression and anxiety, lack of energy, as well as recurrent headaches, especially morning headaches. We talked about trying to maintain a healthy lifestyle in general, as well as Jonathan importance of weight control. We also talked about Jonathan importance of good sleep  hygiene. I recommended Jonathan following at this time: sleep study.  I explained Jonathan sleep test procedure to Jonathan [name] and also outlined possible surgical and non-surgical treatment options of OSA, including Jonathan use of a custom-made dental device (which would require a referral to a specialist dentist or oral surgeon), upper airway surgical options, such as traditional UPPP or a novel less invasive surgical option in Jonathan form of Inspire hypoglossal nerve stimulation (which would involve a referral to an ENT surgeon). I also explained Jonathan  CPAP treatment option to Jonathan [name], who indicated that he would be willing to try CPAP if Jonathan need arises. I explained Jonathan importance of being compliant with PAP treatment, not only for insurance purposes but primarily to improve His symptoms, and for Jonathan [name]'s long term health benefit, including to reduce His cardiovascular risks. I answered all his questions today and Jonathan [name] was in agreement. I plan to see him back after Jonathan sleep study is completed and encouraged him to call with any interim questions, concerns, problems or updates.   Thank you very much for allowing me to participate in Jonathan care of this nice [name]. If I can be of any further assistance to you please do not hesitate to call me at [phone].  Sincerely,   Huston Foley, MD, PhD

## 2020-01-23 ENCOUNTER — Telehealth: Payer: Self-pay

## 2020-01-23 NOTE — Telephone Encounter (Signed)
Calling to schedule home sleep test. 

## 2020-02-05 ENCOUNTER — Telehealth: Payer: Self-pay

## 2020-02-05 NOTE — Telephone Encounter (Signed)
LVM for pt to call me back to schedule sleep study  

## 2020-02-21 ENCOUNTER — Ambulatory Visit (INDEPENDENT_AMBULATORY_CARE_PROVIDER_SITE_OTHER): Payer: 59 | Admitting: Neurology

## 2020-02-21 DIAGNOSIS — G4733 Obstructive sleep apnea (adult) (pediatric): Secondary | ICD-10-CM | POA: Diagnosis not present

## 2020-02-21 DIAGNOSIS — R0681 Apnea, not elsewhere classified: Secondary | ICD-10-CM

## 2020-02-21 DIAGNOSIS — R0683 Snoring: Secondary | ICD-10-CM

## 2020-02-21 DIAGNOSIS — Z820 Family history of epilepsy and other diseases of the nervous system: Secondary | ICD-10-CM

## 2020-02-21 DIAGNOSIS — E669 Obesity, unspecified: Secondary | ICD-10-CM

## 2020-02-21 DIAGNOSIS — R635 Abnormal weight gain: Secondary | ICD-10-CM

## 2020-02-21 DIAGNOSIS — Z9189 Other specified personal risk factors, not elsewhere classified: Secondary | ICD-10-CM

## 2020-02-29 NOTE — Addendum Note (Signed)
Addended by: Star Age on: 02/29/2020 06:25 PM   Modules accepted: Orders

## 2020-02-29 NOTE — Procedures (Signed)
   [facility]  HOME SLEEP TEST (Watch PAT)  STUDY DATE: 02/21/20  DOB: 1981/01/07  MRN: [id number]  ORDERING CLINICIAN: Star Age, MD, PhD   REFERRING CLINICIAN: Hilts, Legrand Como, MD   CLINICAL INFORMATION/HISTORY: 40 year old right-handed gentleman with an underlying medical history of reflux disease, spherocytosis, back pain, and mild obesity, who reports snoring and excessive daytime somnolence as well as witnessed apneas per fianc.    Epworth sleepiness score: 6/24.  BMI: 35.4 kg/m  FINDINGS:   Total Record Time (hours, min): 8hrs 33mins  Total Sleep Time (hours, min):  6hrs  27mins   Percent REM (%):    19.5   Calculated pAHI (per hour): 25.2       REM pAHI:    26.6     NREM pAHI: 24.9   Oxygen Saturation (%) Mean:93 Minimum oxygen saturation (%):  87   O2 Saturation Range (%): 87 - 98  O2Saturation (minutes) <=88%: 0.9  Pulse Mean (bpm):   62  Pulse Range (41 - 110)   IMPRESSION: OSA (obstructive sleep apnea)   RECOMMENDATION:  This home sleep test demonstrates moderate obstructive sleep apnea with a total AHI of 25.2/hour and O2 nadir of 87%.  Snoring was noted and appeared to be in the mild to moderate range.  Treatment with positive airway pressure is recommended. The patient will be advised to proceed with an autoPAP titration/trial at home for now. A full night titration study may be considered to optimize treatment settings, if needed down the road. Please note that untreated obstructive sleep apnea may carry additional perioperative morbidity. Patients with significant obstructive sleep apnea should receive perioperative PAP therapy and the surgeons and particularly the anesthesiologist should be informed of the diagnosis and the severity of the sleep disordered breathing.  Alternative treatment options may include weight loss, surgical options, or dental treatment options. The patient should be cautioned not to drive, work at heights, or  operate dangerous or heavy equipment when tired or sleepy. Review and reiteration of good sleep hygiene measures should be pursued with any patient. Other causes of the patient's symptoms, including circadian rhythm disturbances, an underlying mood disorder, medication effect and/or an underlying medical problem cannot be ruled out based on this test. Clinical correlation is recommended. The patient and his referring provider will be notified of the test results. The patient will be seen in follow up in sleep clinic at [facility].  I certify that I have reviewed the raw data recording prior to the issuance of this report in accordance with the standards of the American Academy of Sleep Medicine (AASM).   INTERPRETING PHYSICIAN:    Star Age, MD, PhD  Board Certified in Neurology and Sleep Medicine  [facility] [address] [place], [place] [address] [phone]

## 2020-02-29 NOTE — Progress Notes (Signed)
Patient referred by Dr. Junius Roads, seen by me on 01/17/20, patient had a HST on 02/21/20.    Please call and notify the patient that the recent home sleep test showed obstructive sleep apnea in the moderate range. I recommend treatment in the form of autoPAP, which means, that we don't have to bring him in for a sleep study with CPAP, but will let him start using a so called autoPAP machine at home, which is a CPAP-like machine with self-adjusting pressures. We will send the order to a local DME company (of his choice, or as per insurance requirement). The DME representative will fit him with a mask, educate him on how to use the machine, how to put the mask on, etc. I have placed an order in the chart. Please send the order, talk to patient, send report to referring MD. We will need a FU in sleep clinic for 10 weeks post-PAP set up, please arrange that with me or one of our NPs. Also reinforce the need for compliance with treatment. Thanks,   Star Age, MD, PhD [facility])

## 2020-03-04 ENCOUNTER — Telehealth: Payer: Self-pay

## 2020-03-04 NOTE — Telephone Encounter (Signed)
I called pt. I advised pt that Dr. Rexene Alberts reviewed their sleep study results and found that pt had moderate OSA. Dr. Rexene Alberts recommends that pt start autopap treatment at home. I reviewed PAP compliance expectations with the pt. Pt is agreeable to starting an auto-PAP. I advised pt that an order will be sent to a DME, [facility], and [facility] will call the pt within about one week after they file with the pt's insurance. [facility] will show the pt how to use the machine, fit for masks, and troubleshoot the auto-PAP if needed.Pt is hesitant to schedule follow-up appointment at this point, due to insurance requirement of 4 hours every night.  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  Patient will call back and let me know if he starts his machine and we can proceed from there to schedule 31 to 90-day follow-up.  Patient was advised insurance will need to see he uses his machine at least 4 hours every night for compliance and that we need to see him back 31 to 90 days after starting.  Patient verbalized understanding.

## 2020-03-04 NOTE — Telephone Encounter (Signed)
-----   Message from Star Age, MD sent at 02/29/2020  6:25 PM EST ----- Patient referred by Dr. Junius Roads, seen by me on 01/17/20, patient had a HST on 02/21/20.    Please call and notify the patient that the recent home sleep test showed obstructive sleep apnea in the moderate range. I recommend treatment in the form of autoPAP, which means, that we don't have to bring him in for a sleep study with CPAP, but will let him start using a so called autoPAP machine at home, which is a CPAP-like machine with self-adjusting pressures. We will send the order to a local DME company (of his choice, or as per insurance requirement). The DME representative will fit him with a mask, educate him on how to use the machine, how to put the mask on, etc. I have placed an order in the chart. Please send the order, talk to patient, send report to referring MD. We will need a FU in sleep clinic for 10 weeks post-PAP set up, please arrange that with me or one of our NPs. Also reinforce the need for compliance with treatment. Thanks,   Star Age, MD, PhD [facility])

## 2020-04-04 ENCOUNTER — Telehealth: Payer: Self-pay

## 2020-04-04 NOTE — Telephone Encounter (Signed)
I called the pt and asked if he had received a start date for CPAP.  Pt was advised DME called him and they are waiting on the machine to come in.   Will touch base again in 3 or so weeks.

## 2020-04-18 NOTE — Telephone Encounter (Signed)
I have reached out to [facility] about information regarding start date.

## 2020-04-29 NOTE — Telephone Encounter (Signed)
I called the pt and he DME is still waiting on machines. Will cb in 4 weeks or so the check status.

## 2020-05-29 ENCOUNTER — Encounter: Payer: Self-pay | Admitting: Neurology

## 2020-06-17 ENCOUNTER — Telehealth: Payer: Self-pay | Admitting: Neurology

## 2020-06-17 NOTE — Telephone Encounter (Signed)
I called the pt and we discussed message. Pt was set up on [place] in 05/29/20 and pt reports every morning after he uses the auot-pap machine he wakes up with a headache. Pt wanted to know if an adjustment in his pressure could be made and see if this helps?  I advised I would discuss with Dr. Rexene Alberts once she gets back in the office tomorrow.  Pt was agreeable.

## 2020-06-17 NOTE — Telephone Encounter (Signed)
Pt states he has not used his CPAP since 05-31 due to every morning pt wakes up with a headache, please call pt to discuss.

## 2020-06-18 NOTE — Telephone Encounter (Signed)
I called pt and we discussed message. He verbalized understanding but does not think his h/a are related to any other factors other than the autopap. Reports he will try it for another week and let us know if the h/a persist.

## 2020-06-18 NOTE — Telephone Encounter (Signed)
I reviewed patient's AutoPap compliance data from start date 05/29/2020.  He used his machine 12 days thus far, no usage after 06/11/2020 is noted on this compliance report.  Average usage of 4 hours and 53 minutes for days on treatment.  Average AHI is at goal at 0.2/h, 95th percentile of pressure at 7.1 cm with a range of 6-12 cm.   We would recommend that we keep the settings the same, headaches could tie in with not getting enough sleep, as in sleep deprivation, hydration may play a role, stress and caffeine intake as well as blood pressure.  He may need to see his primary care for a blood pressure recheck.  Please advise patient to limit his caffeine to 1 or 2 servings per day including any coffee, soda, tea, energy drinks etc. or oral supplement.  Please also advise patient to stay well hydrated with water, 6 to 8 cups/day are recommended generally speaking, 8 ounce size each.  I would recommend he not skip any nights with his autoPAP, and try to keep his AutoPap on all night, every night.  The more he uses it the better he will tolerate it and hopefully the more benefit he will reap from it.

## 2020-06-27 ENCOUNTER — Encounter: Payer: Self-pay | Admitting: Neurology

## 2020-07-29 ENCOUNTER — Other Ambulatory Visit: Payer: Self-pay

## 2020-07-29 ENCOUNTER — Ambulatory Visit (INDEPENDENT_AMBULATORY_CARE_PROVIDER_SITE_OTHER): Payer: 59 | Admitting: Family Medicine

## 2020-07-29 DIAGNOSIS — M546 Pain in thoracic spine: Secondary | ICD-10-CM | POA: Diagnosis not present

## 2020-07-29 NOTE — Progress Notes (Signed)
   Office Visit Note   Patient: [name]           Date of Birth: 08/09/1980           MRN: [id number] Visit Date: 07/29/2020 Requested by: Eunice Blase, MD [address] [place],  [place] [address] PCP: Eunice Blase, MD  Subjective: No chief complaint on file.   HPI: Here with left thoracic pain.  Woke up this morning in severe pain.  Lifted golf clubs yesterday, felt a twinge.  No radicular sxs.  Recently went to [place] for golf trip.              ROS:   All other systems were reviewed and are negative.  Objective: Vital Signs: There were no vitals taken for this visit.  Physical Exam:  General:  Alert and oriented, in no acute distress. Pulm:  Breathing unlabored. Psy:  Normal mood, congruent affect. Skin:  No rash  Back:  Tender trigger point left lower thoracic paraspinous muscles and in rhomboid area.  Imaging: No results found.  Assessment & Plan:  Myofascial thoracic pain. - Trigger point injection given with 5 cc 1% lido w/o epi and 40 mg depo-medrol.       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  Past Surgical History:  Procedure Laterality Date   CLOSED REDUCTION NASAL FRACTURE Bilateral 05/22/2013   Procedure: BILATERAL CLOSED REDUCTION NASAL FRACTURE;  Surgeon: Rozetta Nunnery, MD;  Location: [facility];  Service: ENT;  Laterality: Bilateral;   FOOT TENDON SURGERY Right     as an infant   INGUINAL HERNIA REPAIR Bilateral    as an infant   SHOULDER CAPSULORRHAPHY Right    SPLENECTOMY     TURBINATE REDUCTION Bilateral 05/22/2013   Procedure: [place];  Surgeon: Rozetta Nunnery, MD;  Location: [facility];  Service: ENT;  Laterality: Bilateral;   Social History   Occupational History   Not on file  Tobacco Use   Smoking status: Never   Smokeless tobacco: Current    Types: Snuff  Substance and Sexual Activity   Alcohol use: Yes    Comment: socially   Drug use: No   Sexual activity: Not on file

## 2020-07-31 ENCOUNTER — Ambulatory Visit: Payer: Self-pay | Admitting: Neurology

## 2020-07-31 ENCOUNTER — Ambulatory Visit: Payer: 59 | Admitting: Neurology

## 2020-08-26 ENCOUNTER — Ambulatory Visit: Payer: 59 | Admitting: Neurology

## 2023-08-23 ENCOUNTER — Other Ambulatory Visit: Payer: Self-pay | Admitting: Family Medicine

## 2023-08-23 DIAGNOSIS — E7849 Other hyperlipidemia: Secondary | ICD-10-CM

## 2023-09-02 ENCOUNTER — Ambulatory Visit
Admission: RE | Admit: 2023-09-02 | Discharge: 2023-09-02 | Disposition: A | Discharge status: Home / Self Care | Source: Ambulatory Visit | Attending: Family Medicine | Admitting: Family Medicine

## 2023-09-02 DIAGNOSIS — E7849 Other hyperlipidemia: Secondary | ICD-10-CM

## 2024-02-12 ENCOUNTER — Emergency Department (HOSPITAL_BASED_OUTPATIENT_CLINIC_OR_DEPARTMENT_OTHER)
Admission: EM | Admit: 2024-02-12 | Discharge: 2024-02-12 | Disposition: A | Discharge status: Home / Self Care | Attending: Emergency Medicine | Admitting: Emergency Medicine

## 2024-02-12 ENCOUNTER — Emergency Department (HOSPITAL_BASED_OUTPATIENT_CLINIC_OR_DEPARTMENT_OTHER)

## 2024-02-12 ENCOUNTER — Other Ambulatory Visit: Payer: Self-pay

## 2024-02-12 ENCOUNTER — Encounter (HOSPITAL_BASED_OUTPATIENT_CLINIC_OR_DEPARTMENT_OTHER): Payer: Self-pay

## 2024-02-12 DIAGNOSIS — F17200 Nicotine dependence, unspecified, uncomplicated: Secondary | ICD-10-CM | POA: Insufficient documentation

## 2024-02-12 DIAGNOSIS — W010XXA Fall on same level from slipping, tripping and stumbling without subsequent striking against object, initial encounter: Secondary | ICD-10-CM | POA: Diagnosis not present

## 2024-02-12 DIAGNOSIS — S99912A Unspecified injury of left ankle, initial encounter: Secondary | ICD-10-CM | POA: Diagnosis present

## 2024-02-12 DIAGNOSIS — Y9301 Activity, walking, marching and hiking: Secondary | ICD-10-CM | POA: Insufficient documentation

## 2024-02-12 DIAGNOSIS — M7989 Other specified soft tissue disorders: Secondary | ICD-10-CM | POA: Diagnosis not present

## 2024-02-12 DIAGNOSIS — S82452A Displaced comminuted fracture of shaft of left fibula, initial encounter for closed fracture: Secondary | ICD-10-CM | POA: Insufficient documentation

## 2024-02-12 NOTE — ED Triage Notes (Signed)
 He states he slipped on ice this morning. He c/o left ankle pain and swelling. He insists upon ambulating to room 11.

## 2024-02-12 NOTE — Discharge Instructions (Addendum)
 Please ice the area and elevate.  Tylenol  and ibuprofen  for pain.  Follow-up with Dr. Kendal or your orthopedic provider this week.  Nonweightbearing. splint and crutches for support.  Return if any worsening or concerning symptoms

## 2024-02-12 NOTE — ED Provider Notes (Signed)
 " [place] EMERGENCY DEPARTMENT AT [facility] Provider Note   CSN: [id number] Arrival date & time: 02/12/24  9342     Patient presents with: Ankle Pain   [name] is a 44 y.o. male.  He is here for evaluation of left ankle injury after a slip and fall this morning.  He said he was walking and slipped on the ice twisted his left ankle.  No loss of consciousness.  He said minimal injury to right knee.  No other complaints.  Moderate pain with ambulation.   The history is provided by the patient.  Ankle Pain Location:  Ankle Injury: yes   Mechanism of injury: fall   Fall:    Fall occurred:  Walking   Impact surface:  Ice Pain details:    Onset quality:  Sudden   Timing:  Constant   Progression:  Unchanged Chronicity:  New Worsened by:  Bearing weight      Prior to Admission medications  Medication Sig Start Date End Date Taking? Authorizing Provider  etodolac  (LODINE ) 400 MG tablet Take 1 tablet (400 mg total) by mouth 2 (two) times daily as needed. 12/06/17   Hilts, Ozell, MD  Multiple Vitamin (MULTIVITAMIN) tablet Take 1 tablet by mouth daily.    [provider]    Allergies: Codeine    Review of Systems  Updated Vital Signs BP (!) 144/87 (BP Location: Right Arm)   Pulse 84   Temp 98.2 F (36.8 C) (Oral)   Resp 18   SpO2 95%   Physical Exam Vitals and nursing note reviewed.  Constitutional:      Appearance: Normal appearance. He is well-developed.  HENT:     Head: Normocephalic and atraumatic.  Eyes:     Conjunctiva/sclera: Conjunctivae normal.  Pulmonary:     Effort: Pulmonary effort is normal.  Musculoskeletal:        General: Swelling, tenderness and signs of injury present.     Cervical back: Neck supple.     Comments: He has tender lateral ankle mostly ATFL.  There is a little bit of tenderness of proximal leg.  No medial tenderness.  No calcaneus tenderness or fifth metatarsal tenderness.  No open wounds.  Distal pulses  motor and sensation intact.  Skin:    General: Skin is warm and dry.  Neurological:     General: No focal deficit present.     Mental Status: He is alert.     GCS: GCS eye subscore is 4. GCS verbal subscore is 5. GCS motor subscore is 6.     Sensory: No sensory deficit.     Motor: No weakness.     (all labs ordered are listed, but only abnormal results are displayed) Labs Reviewed - No data to display  EKG: None  Radiology: DG Ankle Complete Left Result Date: 02/12/2024 EXAM: 3 OR MORE VIEW(S) XRAY OF THE LEFT ANKLE 02/12/2024 07:41:07 AM CLINICAL HISTORY: Fall with swelling and pain. COMPARISON: None available. FINDINGS: BONES AND JOINTS: Long oblique fracture of the distal fibular metaphysis with mild lateral displacement. Ankle joint effusion. The medial ankle mortise is at the upper limits of normal at 4 mm. The tibiofibular clear space measures 3 mm, which is within normal limits. Differential diagnosis includes a Weber B ankle fracture with associated syndesmotic injury. Orthopedic consultation is recommended for management of the fracture and potential syndesmotic injury. Consider stress views or MRI for further evaluation of syndesmotic stability if clinically indicated. SOFT TISSUES: Diffuse soft tissue  swelling. IMPRESSION: 1. Oblique fracture of the distal fibular metaphysis with mild lateral displacement. 2. Possible associated deltoid ligament injury and/or syndesmotic injury; recommend orthopedic evaluation and consideration of stress radiographs or weightbearing views to assess stability. 3. Ankle joint effusion. 4. Diffuse soft tissue swelling. Electronically signed by: Waddell Calk MD 02/12/2024 08:36 AM EST RP Workstation: [id number]   DG Tibia/Fibula Left Result Date: 02/12/2024 EXAM: VIEW(S) XRAY OF THE LEFT TIBIA AND FIBULA 02/12/2024 07:41:39 AM COMPARISON: None available. CLINICAL HISTORY: Fall with swelling and pain. FINDINGS: BONES AND JOINTS: Oblique fracture of the  distal fibula. No malalignment. SOFT TISSUES: Soft tissue swelling at the ankle. IMPRESSION: 1. Oblique fracture of the distal fibula. 2. Soft tissue swelling at the ankle. Electronically signed by: Waddell Calk MD 02/12/2024 08:34 AM EST RP Workstation: [id number]     Procedures   Medications Ordered in the ED - No data to display  Clinical Course as of 02/12/24 0939  Sat Feb 12, 2024  9250 X-ray showed eyeing distal ankle fracture above the mortise.  Mortise is otherwise symmetric.  Awaiting radiology reading. [MB]    Clinical Course User Index [MB] Towana Ozell BROCKS, MD                                 Medical Decision Making Amount and/or Complexity of Data Reviewed Radiology: ordered.   This patient complains of left ankle pain; this involves an extensive number of treatment Options and is a complaint that carries with it a high risk of complications and morbidity. The differential includes fracture, dislocation, sprain I ordered imaging studies which included left ankle x-ray and I independently    visualized and interpreted imaging which showed distal fibula fracture with some slight widening Previous records obtained and reviewed in epic no recent admissions I consulted Dr. Kendal orthopedics and discussed lab and imaging findings and discussed disposition.  Social determinants considered, tobacco use Critical Interventions: None  After the interventions stated above, I reevaluated the patient and found patient to be symptomatically improved after being in splint and crutches Admission and further testing considered, patient will follow-up outpatient with orthopedics for further evaluation.  Return instructions discussed      Final diagnoses:  Closed displaced comminuted fracture of shaft of left fibula, initial encounter    ED Discharge Orders     None          Towana Ozell BROCKS, MD 02/12/24 [phone]  "

## 2024-02-16 ENCOUNTER — Other Ambulatory Visit: Payer: Self-pay

## 2024-02-16 ENCOUNTER — Ambulatory Visit: Admitting: Orthopedic Surgery

## 2024-02-16 ENCOUNTER — Encounter (HOSPITAL_COMMUNITY): Payer: Self-pay | Admitting: Orthopedic Surgery

## 2024-02-16 ENCOUNTER — Encounter: Payer: Self-pay | Admitting: Orthopedic Surgery

## 2024-02-16 DIAGNOSIS — S82892A Other fracture of left lower leg, initial encounter for closed fracture: Secondary | ICD-10-CM

## 2024-02-16 NOTE — Anesthesia Preprocedure Evaluation (Signed)
"                                    Anesthesia Evaluation  Patient identified by MRN, date of birth, ID band Patient awake    Reviewed: Allergy & Precautions, NPO status , Patient's Chart, lab work & pertinent test results  History of Anesthesia Complications Negative for: history of anesthetic complications  Airway Mallampati: II  TM Distance: >3 FB Neck ROM: Full    Dental no notable dental hx. (+) Teeth Intact, Dental Advisory Given   Pulmonary sleep apnea    Pulmonary exam normal breath sounds clear to auscultation       Cardiovascular hypertension, (-) angina (-) Past MI Normal cardiovascular exam Rhythm:Regular Rate:Normal     Neuro/Psych negative neurological ROS  negative psych ROS   GI/Hepatic ,GERD  Medicated and Controlled,,  Endo/Other  neg diabetes    Renal/GU    S/p testicular ca negative genitourinary   Musculoskeletal negative musculoskeletal ROS (+)    Abdominal   Peds negative pediatric ROS (+)  Hematology  Hereditary spherocytosis    Anesthesia Other Findings All: codeine  Reproductive/Obstetrics                              Anesthesia Physical Anesthesia Plan  ASA: 3  Anesthesia Plan: Regional and General   Post-op Pain Management: Regional block* and Minimal or no pain anticipated   Induction:   PONV Risk Score and Plan: 3 and Treatment may vary due to age or medical condition, Midazolam  and Ondansetron   Airway Management Planned: LMA  Additional Equipment: None  Intra-op Plan:   Post-operative Plan: Extubation in OR  Informed Consent:      Dental advisory given  Plan Discussed with:   Anesthesia Plan Comments: (GA w L pop Sciatic)         Anesthesia Quick Evaluation  "

## 2024-02-16 NOTE — Progress Notes (Signed)
 SDW call  Patient was given pre-op instructions over the phone. Patient verbalized understanding of instructions provided.  Denied any SOB, fever or cough   PCP - Dr. Ozell Dopp Cardiologist - denies Pulmonary:    PPM/ICD - denies Device Orders - na Rep Notified - na   Chest x-ray -  EKG -   Stress Test - ECHO -  Cardiac Cath -   Sleep Study/sleep apnea/CPAP: denies  Non-diabetic  Blood Thinner Instructions: denies Aspirin  Instructions:denies   ERAS Protcol - Clears until 0715  Anesthesia review: Yes to look at for splenectomy, not needed per Isaiah  Your procedure is scheduled on Thursday February 17, 2024  Report to [facility] Main Entrance A at  0745  A.M., then check in with the Admitting office.  Call this number if you have problems the morning of surgery:  [phone]   If you have any questions prior to your surgery date call [phone]: Open Monday-Friday 8am-4pm If you experience any cold or flu symptoms such as cough, fever, chills, shortness of breath, etc. between now and your scheduled surgery, please notify us  at the above number    Remember:  Do not eat after midnight the night before your surgery  You may drink clear liquids until   0715  the morning of your surgery.   Clear liquids allowed are: Water, Non-Citrus Juices (without pulp), Carbonated Beverages, Clear Tea, Black Coffee ONLY (NO MILK, CREAM OR POWDERED CREAMER of any kind), and Gatorade   Take these medicines the morning of surgery with A SIP OF WATER:  Flonase, xyal, prilosec  As needed: Aftrin  As of today, STOP taking any Aspirin  (unless otherwise instructed by your surgeon) Aleve, Naproxen, Ibuprofen , Motrin , Advil , Goody's, BC's, all herbal medications, fish oil, and all vitamins.

## 2024-02-16 NOTE — Progress Notes (Signed)
 "  Office Visit Note   Patient: [name]           Date of Birth: [birth year]           MRN: [id number] Visit Date: 02/16/2024 Requested by: Hughie Sharper, MD [address] [place],  [place] [address] PCP: Hughie Sharper, MD  Subjective: Chief Complaint  Patient presents with   Left Ankle - Fracture    DOI: 02/12/24    HPI: Jonathan FALERO is a 44 y.o. male who presents to the office reporting left ankle pain.  Date of injury 02/12/2024.  He slipped and fell on the ice and rolled his ankle.  Has been nonweightbearing.  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  No personal or family history of DVT or pulmonary embolism..                ROS: All systems reviewed are negative as they relate to the chief complaint within the history of present illness.  Patient denies fevers or chills.  Assessment & Plan: Visit Diagnoses:  1. Closed left ankle fracture, initial encounter     Plan: Impression is left lateral malleolus fracture with displacement enough to make the mortise clear space widened on stress radiographs.  There is definitely more medial to lateral translation on the left compared to the right both on physical exam and under stress views.  For this reason open reduction internal fixation of that lateral malleolar fracture is indicated.  Risk and benefits are discussed with the patient include not limited to infection or vessel damage incomplete healing as well as ankle stiffness and potential need for more surgery.  Patient understands risk and benefits and wishes to proceed.  All questions answered.  Follow-Up Instructions: No follow-ups on file.   Orders:  Orders Placed This Encounter  Procedures   XR Ankle Complete Left   DG C-Arm 1-60 Min-No Report   No orders of the defined types were placed in this encounter.     Procedures: No procedures performed   Clinical Data: No additional findings.  Objective: Vital Signs:  There were no vitals taken for this visit.  Physical Exam:  Constitutional: Patient appears well-developed HEENT:  Head: Normocephalic Eyes:EOM are normal Neck: Normal range of motion Cardiovascular: Normal rate Pulmonary/chest: Effort normal Neurologic: Patient is alert Skin: Skin is warm Psychiatric: Patient has normal mood and affect  Ortho Exam: Ortho exam demonstrates mild swelling in that left ankle.  No calf tenderness and negative Homans.  Pedal pulse palpable.  Ankle dorsiflexion plantarflexion intact.  No medial sided tenderness is present.  There is increased medial to lateral translation of the heel and talus within the mortise on the left compared to the right by 2 to 3 mm on exam.  This is confirmed under fluoroscopic stress examination as well.  Specialty Comments:  No specialty comments available.  Imaging: XR Ankle Complete Left Result Date: 02/16/2024 AP lateral mortise radiographs left ankle reviewed.  Lateral malleolar fracture is present.  2 to 3 mm of displacement.  Slight widening of the medial clear space is present which is more pronounced on stress views obtained on the same day.  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  Past Surgical History:  Procedure Laterality Date   CLOSED REDUCTION NASAL FRACTURE Bilateral 05/22/2013   Procedure: BILATERAL CLOSED REDUCTION NASAL FRACTURE;   Surgeon: Lonni FORBES Angle, MD;  Location: [facility];  Service: ENT;  Laterality: Bilateral;   FOOT TENDON SURGERY Right    as an infant   INGUINAL HERNIA REPAIR Bilateral    as an infant   SHOULDER CAPSULORRHAPHY Right    SPLENECTOMY     TURBINATE REDUCTION Bilateral 05/22/2013   Procedure: BILATERAL TURBINATE REDUCTION;  Surgeon: Lonni FORBES Angle, MD;  Location: [facility];  Service: ENT;  Laterality: Bilateral;   Social History   Occupational History   Not on file  Tobacco Use   Smoking status: Never   Smokeless tobacco: Former    Types: Snuff    Quit date: 2020  Vaping Use   Vaping status: Never Used  Substance and Sexual Activity   Alcohol use: Yes    Comment: socially   Drug use: No   Sexual activity: Not on file        "

## 2024-02-17 ENCOUNTER — Ambulatory Visit (HOSPITAL_COMMUNITY)

## 2024-02-17 ENCOUNTER — Encounter (HOSPITAL_COMMUNITY): Payer: Self-pay | Admitting: Vascular Surgery

## 2024-02-17 ENCOUNTER — Telehealth: Payer: Self-pay | Admitting: Orthopedic Surgery

## 2024-02-17 ENCOUNTER — Encounter (HOSPITAL_COMMUNITY): Payer: Self-pay | Admitting: Orthopedic Surgery

## 2024-02-17 ENCOUNTER — Encounter: Admission: RE | Disposition: A | Payer: Self-pay | Attending: Orthopedic Surgery

## 2024-02-17 ENCOUNTER — Ambulatory Visit (HOSPITAL_COMMUNITY)
Admission: RE | Admit: 2024-02-17 | Discharge: 2024-02-17 | Disposition: A | Attending: Orthopedic Surgery | Admitting: Orthopedic Surgery

## 2024-02-17 DIAGNOSIS — Z01818 Encounter for other preprocedural examination: Secondary | ICD-10-CM

## 2024-02-17 HISTORY — DX: Malignant (primary) neoplasm, unspecified: C80.1

## 2024-02-17 LAB — BASIC METABOLIC PANEL WITH GFR
Anion gap: 12 (ref 5–15)
BUN: 15 mg/dL (ref 6–20)
CO2: 22 mmol/L (ref 22–32)
Calcium: 9.5 mg/dL (ref 8.9–10.3)
Chloride: 105 mmol/L (ref 98–111)
Creatinine, Ser: 0.84 mg/dL (ref 0.61–1.24)
GFR, Estimated: >60 mL/min
Glucose, Bld: 94 mg/dL (ref 70–99)
Potassium: 4.3 mmol/L (ref 3.5–5.1)
Sodium: 139 mmol/L (ref 135–145)

## 2024-02-17 LAB — CBC
HCT: 43.4 % (ref 39.0–52.0)
Hemoglobin: 15.2 g/dL (ref 13.0–17.0)
MCH: 28.8 pg (ref 26.0–34.0)
MCHC: 35 g/dL (ref 30.0–36.0)
MCV: 82.2 fL (ref 80.0–100.0)
Platelets: 700 10*3/uL — ABNORMAL HIGH (ref 150–400)
RBC: 5.28 MIL/uL (ref 4.22–5.81)
RDW: 14 % (ref 11.5–15.5)
WBC: 9.8 10*3/uL (ref 4.0–10.5)
nRBC: 0 % (ref 0.0–0.2)

## 2024-02-17 MED ORDER — LIDOCAINE 2% (20 MG/ML) 5 ML SYRINGE
INTRAMUSCULAR | Status: DC | PRN
  Administered 2024-02-17: 100 mg via INTRAVENOUS

## 2024-02-17 MED ORDER — CHLORHEXIDINE GLUCONATE 0.12 % MT SOLN
OROMUCOSAL | Status: AC
  Administered 2024-02-17: 15 mL via OROMUCOSAL
  Filled 2024-02-17: qty 15

## 2024-02-17 MED ORDER — HYDROMORPHONE HCL 1 MG/ML IJ SOLN: 0.2500 mg | INTRAMUSCULAR | Status: DC | PRN

## 2024-02-17 MED ORDER — PROPOFOL 10 MG/ML IV BOLUS
INTRAVENOUS | Status: AC
  Filled 2024-02-17: qty 20

## 2024-02-17 MED ORDER — OXYCODONE HCL 5 MG PO TABS: 5.0000 mg | ORAL_TABLET | Freq: Once | ORAL | Status: DC | PRN

## 2024-02-17 MED ORDER — DEXMEDETOMIDINE HCL IN NACL 400 MCG/100ML IV SOLN: INTRAVENOUS | Status: DC | PRN

## 2024-02-17 MED ORDER — DROPERIDOL 2.5 MG/ML IJ SOLN
INTRAMUSCULAR | Status: AC
  Filled 2024-02-17: qty 2

## 2024-02-17 MED ORDER — CLONIDINE HCL (ANALGESIA) 100 MCG/ML EP SOLN
EPIDURAL | Status: AC
  Filled 2024-02-17: qty 10

## 2024-02-17 MED ORDER — ONDANSETRON HCL 4 MG/2ML IJ SOLN: 4.0000 mg | Freq: Once | INTRAMUSCULAR | Status: DC

## 2024-02-17 MED ORDER — ASPIRIN 81 MG PO CHEW: 81.0000 mg | CHEWABLE_TABLET | Freq: Two times a day (BID) | ORAL | 0 refills | Status: AC

## 2024-02-17 MED ORDER — MORPHINE SULFATE (PF) 4 MG/ML IV SOLN
INTRAVENOUS | Status: AC
  Filled 2024-02-17: qty 1

## 2024-02-17 MED ORDER — DROPERIDOL 2.5 MG/ML IJ SOLN
0.6250 mg | Freq: Once | INTRAMUSCULAR | Status: AC | PRN
  Administered 2024-02-17: 0.625 mg via INTRAVENOUS

## 2024-02-17 MED ORDER — PROPOFOL 10 MG/ML IV BOLUS
INTRAVENOUS | Status: DC | PRN
  Administered 2024-02-17: 200 mg via INTRAVENOUS

## 2024-02-17 MED ORDER — BUPIVACAINE HCL (PF) 0.25 % IJ SOLN
INTRAMUSCULAR | Status: AC
  Filled 2024-02-17: qty 30

## 2024-02-17 MED ORDER — CLONIDINE HCL (ANALGESIA) 100 MCG/ML EP SOLN
EPIDURAL | Status: DC | PRN
  Administered 2024-02-17: 100 ug

## 2024-02-17 MED ORDER — DEXMEDETOMIDINE HCL IN NACL 80 MCG/20ML IV SOLN
INTRAVENOUS | Status: DC | PRN
  Administered 2024-02-17: 8 ug via INTRAVENOUS
  Administered 2024-02-17: 4 ug via INTRAVENOUS
  Administered 2024-02-17: 12 ug via INTRAVENOUS
  Administered 2024-02-17: 8 ug via INTRAVENOUS

## 2024-02-17 MED ORDER — DEXAMETHASONE SOD PHOSPHATE PF 10 MG/ML IJ SOLN
INTRAMUSCULAR | Status: DC | PRN
  Administered 2024-02-17: 10 mg via INTRAVENOUS

## 2024-02-17 MED ORDER — ACETAMINOPHEN 10 MG/ML IV SOLN: 1000.0000 mg | Freq: Once | INTRAVENOUS | Status: DC | PRN

## 2024-02-17 MED ORDER — CEFAZOLIN SODIUM-DEXTROSE 2-4 GM/100ML-% IV SOLN
2.0000 g | INTRAVENOUS | Status: AC
  Administered 2024-02-17: 2 g via INTRAVENOUS
  Filled 2024-02-17: qty 100

## 2024-02-17 MED ORDER — FENTANYL CITRATE (PF) 100 MCG/2ML IJ SOLN: 100.0000 ug | Freq: Once | INTRAMUSCULAR | Status: AC

## 2024-02-17 MED ORDER — MIDAZOLAM HCL 2 MG/2ML IJ SOLN
INTRAMUSCULAR | Status: AC
  Filled 2024-02-17: qty 2

## 2024-02-17 MED ORDER — MORPHINE SULFATE 4 MG/ML IJ SOLN
INTRAMUSCULAR | Status: DC | PRN
  Administered 2024-02-17: 15 mL

## 2024-02-17 MED ORDER — METHOCARBAMOL 500 MG PO TABS: 500.0000 mg | ORAL_TABLET | Freq: Three times a day (TID) | ORAL | 1 refills | Status: AC | PRN

## 2024-02-17 MED ORDER — CHLORHEXIDINE GLUCONATE 0.12 % MT SOLN: 15.0000 mL | Freq: Once | OROMUCOSAL | Status: AC

## 2024-02-17 MED ORDER — ROPIVACAINE HCL 5 MG/ML IJ SOLN
INTRAMUSCULAR | Status: DC | PRN
  Administered 2024-02-17: 30 mL via PERINEURAL

## 2024-02-17 MED ORDER — ONDANSETRON 4 MG PO TBDP: 4.0000 mg | ORAL_TABLET | Freq: Three times a day (TID) | ORAL | 0 refills | Status: AC | PRN

## 2024-02-17 MED ORDER — MIDAZOLAM HCL 2 MG/2ML IJ SOLN
INTRAMUSCULAR | Status: AC
  Administered 2024-02-17: 2 mg via INTRAVENOUS
  Filled 2024-02-17: qty 2

## 2024-02-17 MED ORDER — OXYCODONE HCL 5 MG PO TABS: 5.0000 mg | ORAL_TABLET | ORAL | 0 refills | Status: DC | PRN

## 2024-02-17 MED ORDER — HYDROCODONE-ACETAMINOPHEN 5-325 MG PO TABS: 1.0000 | ORAL_TABLET | ORAL | 0 refills | Status: AC | PRN

## 2024-02-17 MED ORDER — 0.9 % SODIUM CHLORIDE (POUR BTL) OPTIME
TOPICAL | Status: DC | PRN
  Administered 2024-02-17: 1000 mL

## 2024-02-17 MED ORDER — FENTANYL CITRATE (PF) 100 MCG/2ML IJ SOLN
INTRAMUSCULAR | Status: AC
  Filled 2024-02-17: qty 2

## 2024-02-17 MED ORDER — VANCOMYCIN HCL 500 MG IV SOLR
INTRAVENOUS | Status: AC
  Filled 2024-02-17: qty 10

## 2024-02-17 MED ORDER — OXYCODONE HCL 5 MG/5ML PO SOLN: 5.0000 mg | Freq: Once | ORAL | Status: DC | PRN

## 2024-02-17 MED ORDER — CELECOXIB 100 MG PO CAPS: 100.0000 mg | ORAL_CAPSULE | Freq: Two times a day (BID) | ORAL | 0 refills | Status: AC

## 2024-02-17 MED ORDER — VANCOMYCIN HCL 500 MG IV SOLR
INTRAVENOUS | Status: DC | PRN
  Administered 2024-02-17: 500 mg via TOPICAL

## 2024-02-17 MED ORDER — ONDANSETRON HCL 4 MG/2ML IJ SOLN
INTRAMUSCULAR | Status: DC | PRN
  Administered 2024-02-17: 4 mg via INTRAVENOUS

## 2024-02-17 MED ORDER — ONDANSETRON HCL 4 MG/2ML IJ SOLN
INTRAMUSCULAR | Status: AC
  Filled 2024-02-17: qty 2

## 2024-02-17 MED ORDER — TRANEXAMIC ACID-NACL 1000-0.7 MG/100ML-% IV SOLN
1000.0000 mg | INTRAVENOUS | Status: AC
  Administered 2024-02-17: 1000 mg via INTRAVENOUS
  Filled 2024-02-17: qty 100

## 2024-02-17 MED ORDER — MIDAZOLAM HCL (PF) 2 MG/2ML IJ SOLN: 2.0000 mg | Freq: Once | INTRAMUSCULAR | Status: AC

## 2024-02-17 MED ORDER — ONDANSETRON HCL 4 MG/2ML IJ SOLN
4.0000 mg | Freq: Once | INTRAMUSCULAR | Status: AC | PRN
  Administered 2024-02-17: 4 mg via INTRAVENOUS

## 2024-02-17 MED ORDER — FENTANYL CITRATE (PF) 100 MCG/2ML IJ SOLN
INTRAMUSCULAR | Status: DC | PRN
  Administered 2024-02-17: 50 ug via INTRAVENOUS

## 2024-02-17 MED ORDER — ORAL CARE MOUTH RINSE: 15.0000 mL | Freq: Once | OROMUCOSAL | Status: AC

## 2024-02-17 MED ORDER — LACTATED RINGERS IV SOLN: INTRAVENOUS | Status: DC

## 2024-02-17 MED ORDER — FENTANYL CITRATE (PF) 100 MCG/2ML IJ SOLN
INTRAMUSCULAR | Status: AC
  Administered 2024-02-17: 100 ug via INTRAVENOUS
  Filled 2024-02-17: qty 2

## 2024-02-17 NOTE — Anesthesia Postprocedure Evaluation (Signed)
"   Anesthesia Post Note  Patient: [name]  Procedure(s) Performed: OPEN REDUCTION INTERNAL FIXATION (ORIF) ANKLE FRACTURE (Left: Ankle)     Patient location during evaluation: PACU Anesthesia Type: Regional and General Level of consciousness: awake and alert Pain management: pain level controlled Vital Signs Assessment: post-procedure vital signs reviewed and stable Respiratory status: spontaneous breathing, nonlabored ventilation, respiratory function stable and patient connected to nasal cannula oxygen Cardiovascular status: blood pressure returned to baseline and stable Postop Assessment: no apparent nausea or vomiting Anesthetic complications: no   No notable events documented.  Last Vitals:  Vitals:   02/17/24 1315 02/17/24 1330  BP: 121/68 124/76  Pulse: 76 63  Resp: 16 10  Temp:  36.9 C  SpO2: 97% 94%    Last Pain:  Vitals:   02/17/24 1238  TempSrc:   PainSc: 0-No pain                 Garnette LABOR Chris Cripps      "

## 2024-02-17 NOTE — H&P (Signed)
 [name] is an 44 y.o. male.   Chief Complaint: left ankle pain HPI: [name] is a 44 y.o. male who presents to the office reporting left ankle pain.  Date of injury 02/12/2024.  He slipped and fell on the ice and rolled his ankle.  Has been nonweightbearing.  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  No personal or family history of DVT or pulmonary embolism   Past Medical History:  Diagnosis Date   Cancer [facility])    testicular at age 8   GERD (gastroesophageal reflux disease)    TUMS prn   History of testicular cancer    Laceration of nose 05/17/2013   dermabond   Nasal septum fracture 05/17/2013   Nasal turbinate hypertrophy 05/12/2013   Spherocytosis    Ulcer of mouth 05/19/2013    Past Surgical History:  Procedure Laterality Date   CLOSED REDUCTION NASAL FRACTURE Bilateral 05/22/2013   Procedure: BILATERAL CLOSED REDUCTION NASAL FRACTURE;  Surgeon: Lonni FORBES Angle, MD;  Location: [facility];  Service: ENT;  Laterality: Bilateral;   FOOT TENDON SURGERY Right    as an infant   INGUINAL HERNIA REPAIR Bilateral    as an infant   SHOULDER CAPSULORRHAPHY Right    SPLENECTOMY     TURBINATE REDUCTION Bilateral 05/22/2013   Procedure: BILATERAL TURBINATE REDUCTION;  Surgeon: Lonni FORBES Angle, MD;  Location: [facility];  Service: ENT;  Laterality: Bilateral;    History reviewed. No pertinent family history. Social History:  reports that he has never smoked. He quit smokeless tobacco use about 6 years ago.  His smokeless tobacco use included snuff. He reports current alcohol use. He reports that he does not use drugs.  Allergies: Allergies[1]  Facility-Administered Medications Prior to Admission  Medication Dose Route Frequency Provider Last Rate Last Admin   methylPREDNISolone  acetate (DEPO-MEDROL ) injection 20 mg  20 mg Intra-Lesional Once Hilts, Michael, MD       Medications Prior to Admission   Medication Sig Dispense Refill   fluticasone (FLONASE) 50 MCG/ACT nasal spray Place 1 spray into both nostrils in the morning.     ibuprofen  (ADVIL ) 200 MG tablet Take 400-600 mg by mouth daily as needed for headache.     levocetirizine (XYZAL) 5 MG tablet Take 5 mg by mouth in the morning.     omeprazole (PRILOSEC) 20 MG capsule Take 20 mg by mouth daily before breakfast.     oxymetazoline  (AFRIN) 0.05 % nasal spray Place 1 spray into both nostrils 2 (two) times daily as needed for congestion.      Results for orders placed or performed during the hospital encounter of 02/17/24 (from the past 48 hours)  CBC     Status: Abnormal   Collection Time: 02/17/24  9:03 AM  Result Value Ref Range   WBC 9.8 4.0 - 10.5 K/uL   RBC 5.28 4.22 - 5.81 MIL/uL   Hemoglobin 15.2 13.0 - 17.0 g/dL   HCT 56.5 60.9 - 47.9 %   MCV 82.2 80.0 - 100.0 fL   MCH 28.8 26.0 - 34.0 pg   MCHC 35.0 30.0 - 36.0 g/dL   RDW 85.9 88.4 - 84.4 %   Platelets 700 (H) 150 - 400 K/uL   nRBC 0.0 0.0 - 0.2 %    Comment: Performed at [facility], [address] [place], [place] [address]  Basic metabolic panel     Status: None  Collection Time: 02/17/24  9:03 AM  Result Value Ref Range   Sodium 139 135 - 145 mmol/L   Potassium 4.3 3.5 - 5.1 mmol/L   Chloride 105 98 - 111 mmol/L   CO2 22 22 - 32 mmol/L   Glucose, Bld 94 70 - 99 mg/dL    Comment: Glucose reference range applies only to samples taken after fasting for at least 8 hours.   BUN 15 6 - 20 mg/dL   Creatinine, Ser 9.15 0.61 - 1.24 mg/dL   Calcium 9.5 8.9 - 89.6 mg/dL   GFR, Estimated >39 >39 mL/min    Comment: (NOTE) Calculated using the CKD-EPI Creatinine Equation (2021)    Anion gap 12 5 - 15    Comment: Performed at [facility], [address], [place], [place] [address]   XR Ankle Complete Left Result Date: 02/16/2024 AP lateral mortise radiographs left ankle reviewed.  Lateral malleolar fracture is present.  2 to 3 mm of displacement.   Slight widening of the medial clear space is present which is more pronounced on stress views obtained on the same day.  No medial malleolar pathology   Review of Systems  Musculoskeletal:  Positive for arthralgias.  All other systems reviewed and are negative.   Blood pressure (!) 140/73, pulse 74, temperature 98.2 F (36.8 C), temperature source Oral, resp. rate 12, height 6' 2 (1.88 m), weight 129.3 kg, SpO2 97%. Physical Exam Vitals reviewed.  HENT:     Head: Normocephalic.     Nose: Nose normal.     Mouth/Throat:     Mouth: Mucous membranes are moist.  Eyes:     Pupils: Pupils are equal, round, and reactive to light.  Cardiovascular:     Rate and Rhythm: Normal rate.     Pulses: Normal pulses.  Pulmonary:     Effort: Pulmonary effort is normal.  Abdominal:     General: Abdomen is flat.  Musculoskeletal:     Cervical back: Normal range of motion.  Skin:    General: Skin is warm.     Capillary Refill: Capillary refill takes less than 2 seconds.  Neurological:     General: No focal deficit present.     Mental Status: He is alert.  Psychiatric:        Mood and Affect: Mood normal.    Ortho exam demonstrates mild swelling in that left ankle. No calf tenderness and negative Homans. Pedal pulse palpable. Ankle dorsiflexion plantarflexion intact. No medial sided tenderness is present. There is increased medial to lateral translation of the heel and talus within the mortise on the left compared to the right by 2 to 3 mm on exam. This is confirmed under fluoroscopic stress examination as well.   Assessment/Plan Impression is left lateral malleolus fracture with displacement enough to make the mortise clear space widened on stress radiographs. There is definitely more medial to lateral translation on the left compared to the right both on physical exam and under stress views. For this reason open reduction internal fixation of that lateral malleolar fracture is indicated. Risk and  benefits are discussed with the patient include not limited to infection or vessel damage incomplete healing as well as ankle stiffness and potential need for more surgery. Patient understands risk and benefits and wishes to proceed. All questions answered.   KANDICE Glendia Hutchinson, MD 02/17/2024, 9:42 AM       [1]  Allergies Allergen Reactions   Codeine Other (See Comments)  HYPERACTIVITY - AS A CHILD

## 2024-02-17 NOTE — Telephone Encounter (Signed)
 I spoke with patient's wife. She did not see Zofran  listed on the discharge instructions, however, it was at the pharmacy when she picked up his meds. She does note that he is pale, but has been nauseated. I explained that this could be due to the anesthesia and to continue with zofran  as needed. Asked that she return call if she has any other problems or questions.

## 2024-02-17 NOTE — Telephone Encounter (Signed)
 Patient's wife called. Says the nausea medication did not get called in. Says patient needs it to take with his pain medication.

## 2024-02-17 NOTE — Anesthesia Procedure Notes (Signed)
 Anesthesia Regional Block: Popliteal block   Pre-Anesthetic Checklist: , timeout performed,  Correct Patient, Correct Site, Correct Laterality,  Correct Procedure, Correct Position, site marked,  Risks and benefits discussed,  Pre-op evaluation,  At surgeon's request and post-op pain management  Laterality: Lower and Left  Prep: Maximum Sterile Barrier Precautions used, chloraprep       Needles:  Injection technique: Single-shot  Needle Type: Echogenic Needle     Needle Length: 9cm  Needle Gauge: 21     Additional Needles:   Procedures:,,,, ultrasound used (permanent image in chart),,    Narrative:  Start time: 02/17/2024 9:29 AM End time: 02/17/2024 9:34 AM Injection made incrementally with aspirations every 5 mL.  Performed by: Personally  Anesthesiologist: Jefm Garnette LABOR, MD  Additional Notes: Block assessed. Patient tolerated procedure well.

## 2024-02-17 NOTE — Op Note (Unsigned)
 [name], [name] MEDICAL RECORD NO: [id number] ACCOUNT NO: [id number] DATE OF BIRTH: Oct 30, 1980 FACILITY: [facility] LOCATION: [facility] PHYSICIAN: Cordella RAMAN. Addie, MD  Operative Report   DATE OF PROCEDURE: 02/17/2024  PREOPERATIVE DIAGNOSIS:  Left ankle lateral malleolar fracture with talar instability.  POSTOPERATIVE DIAGNOSIS:  Left ankle lateral malleolar fracture with talar instability.  PROCEDURE:  Left ankle lateral malleolar fracture open reduction internal fixation.  SURGEON:  Cordella RAMAN. Addie, MD  ASSISTANT:  Herlene Calix, PA  INDICATIONS:  The patient is a patient who sustained a left ankle fracture and presents for operative management after stress views showed lateral talar translation with stress.  DESCRIPTION OF PROCEDURE:  The patient was brought to the operating room where general anesthetic was induced, preoperative antibiotics administered, and a time-out was called.  The left leg was prescrubbed with alcohol and Betadine, allowed to air dry,  prepped with DuraPrep solution, and draped in a sterile manner.  Ioban was used to cover the operative field.  After calling a time-out, an ankle Esmarch was utilized for about 45 minutes.  An incision was made from the inferior tip of the malleolus down  to approximately about 8.5 cm.  Skin and subcutaneous tissue were sharply divided.  The periosteum overlying the fracture site as well as the lateral malleolus and distal fibula was elevated.  Care was taken to avoid injury to the superficial peroneal  nerve.  The fracture was identified and reduced.  A lag screw was placed anterior proximal to posterior distal.  Good fixation was achieved and confirmed under fluoroscopy in the AP and lateral planes.  A 7-hole Smith and Nephew plate was then applied  with a combination of locking and nonlocking screws proximally and a combination of nonlocking and locking screws distally.  Secure fixation was achieved.  The stress test was then  performed, which was improved and normal compared to the right-hand side.   The tourniquet was released.  Bleeding points encountered were controlled using electrocautery.  Thorough irrigation was performed.  Numbing medicine was placed in the skin, which was 15 mL of a clonidine , Marcaine , and morphine  mixture.  Vancomycin   powder was then placed on the plate.  The incision was then closed using #1 Vicryl suture, using 0 Vicryl suture, 2-0 Vicryl suture, and 3-0 nylon suture.  An Aquacel dressing as well as a posterior splint was applied.  The patient tolerated the  procedure well without immediate complications and was transferred to the recovery room in stable condition.  Luke's assistance was required for all tasks including retraction, opening, closing, and mobilization of tissue.  His assistance was of medical  necessity.     D: 02/17/2024 12:27:17 pm T: 02/17/2024 12:29:00 pm  JOB: [id number]/ [id number]

## 2024-02-17 NOTE — Anesthesia Procedure Notes (Signed)
 Procedure Name: LMA Insertion Date/Time: 02/17/2024 10:47 AM  Performed by: Evette Ade, CRNAPre-anesthesia Checklist: Patient identified, Emergency Drugs available, Patient being monitored, Suction available and Timeout performed Patient Re-evaluated:Patient Re-evaluated prior to induction Oxygen Delivery Method: Circle system utilized Preoxygenation: Pre-oxygenation with 100% oxygen Induction Type: IV induction LMA: LMA inserted LMA Size: 5.0 Number of attempts: 1 Placement Confirmation: positive ETCO2 and breath sounds checked- equal and bilateral Tube secured with: Tape Dental Injury: Teeth and Oropharynx as per pre-operative assessment

## 2024-02-17 NOTE — Brief Op Note (Signed)
" ° °  02/17/2024  12:23 PM  PATIENT:  [name]  44 y.o. male  PRE-OPERATIVE DIAGNOSIS:  left ankle lateral malleolar fracture  POST-OPERATIVE DIAGNOSIS:  left ankle lateral malleolar fracture  PROCEDURE:  Procedures: OPEN REDUCTION INTERNAL FIXATION (ORIF) ANKLE FRACTURE  SURGEON:  Surgeon(s): Addie Cordella Hamilton, MD  ASSISTANT: magnant pa  ANESTHESIA:   general  EBL: 25 ml    Total I/O In: 1000 [I.V.:1000] Out: 20 [Blood:20]  BLOOD ADMINISTERED: none  DRAINS: none   LOCAL MEDICATIONS USED:  vanco, marcaine  morphine  clonidine   SPECIMEN:  No Specimen  COUNTS:  YES  TOURNIQUET:  * Missing tourniquet times found for documented tourniquets in log: [id number] *  DICTATION: .Other Dictation: Dictation Number [id number]  PLAN OF CARE: Discharge to home after PACU  PATIENT DISPOSITION:  PACU - hemodynamically stable              "

## 2024-02-17 NOTE — Telephone Encounter (Signed)
 Medication was sent to and received by pharmacy. I tried to call, no answer. Will try again.

## 2024-02-17 NOTE — Transfer of Care (Signed)
 Immediate Anesthesia Transfer of Care Note  Patient: [name]  Procedure(s) Performed: OPEN REDUCTION INTERNAL FIXATION (ORIF) ANKLE FRACTURE (Left: Ankle)  Patient Location: PACU  Anesthesia Type:General and Regional  Level of Consciousness: drowsy and patient cooperative  Airway & Oxygen Therapy: Patient Spontanous Breathing and Patient connected to nasal cannula oxygen  Post-op Assessment: Report given to RN, Post -op Vital signs reviewed and stable, and Patient moving all extremities X 4  Post vital signs: Reviewed and stable  Last Vitals:  Vitals Value Taken Time  BP 131/90 02/17/24 12:38  Temp    Pulse 105 02/17/24 12:40  Resp 25 02/17/24 12:40  SpO2 94 % 02/17/24 12:40  Vitals shown include unfiled device data.  Last Pain:  Vitals:   02/17/24 0851  TempSrc:   PainSc: 0-No pain         Complications: No notable events documented.

## 2024-02-25 ENCOUNTER — Encounter: Admitting: Surgical
